# Patient Record
Sex: Female | Born: 1970 | Race: Black or African American | Hispanic: No | Marital: Single | Smoking: Never smoker
Health system: Southern US, Community
[De-identification: ages and names within clinical notes are randomized; demographics above are authoritative.]

## PROBLEM LIST (undated history)

## (undated) DIAGNOSIS — Z9889 Other specified postprocedural states: Secondary | ICD-10-CM

## (undated) DIAGNOSIS — I10 Essential (primary) hypertension: Secondary | ICD-10-CM

## (undated) DIAGNOSIS — R112 Nausea with vomiting, unspecified: Secondary | ICD-10-CM

## (undated) DIAGNOSIS — R87619 Unspecified abnormal cytological findings in specimens from cervix uteri: Secondary | ICD-10-CM

## (undated) DIAGNOSIS — Z5189 Encounter for other specified aftercare: Secondary | ICD-10-CM

## (undated) HISTORY — DX: Encounter for other specified aftercare: Z51.89

## (undated) HISTORY — DX: Unspecified abnormal cytological findings in specimens from cervix uteri: R87.619

## (undated) HISTORY — PX: WISDOM TOOTH EXTRACTION: SHX21

## (undated) HISTORY — PX: BREAST REDUCTION SURGERY: SHX8

---

## 1998-08-17 ENCOUNTER — Other Ambulatory Visit: Admission: RE | Admit: 1998-08-17 | Discharge: 1998-08-17 | Payer: Self-pay | Admitting: Family Medicine

## 1999-05-17 ENCOUNTER — Emergency Department (HOSPITAL_COMMUNITY): Admission: EM | Admit: 1999-05-17 | Discharge: 1999-05-17 | Payer: Self-pay

## 1999-06-06 ENCOUNTER — Other Ambulatory Visit: Admission: RE | Admit: 1999-06-06 | Discharge: 1999-06-06 | Payer: Self-pay | Admitting: Obstetrics and Gynecology

## 1999-09-20 ENCOUNTER — Inpatient Hospital Stay (HOSPITAL_COMMUNITY): Admission: AD | Admit: 1999-09-20 | Discharge: 1999-09-20 | Payer: Self-pay | Admitting: *Deleted

## 1999-10-04 ENCOUNTER — Inpatient Hospital Stay (HOSPITAL_COMMUNITY): Admission: AD | Admit: 1999-10-04 | Discharge: 1999-10-04 | Payer: Self-pay | Admitting: *Deleted

## 1999-10-10 ENCOUNTER — Other Ambulatory Visit: Admission: RE | Admit: 1999-10-10 | Discharge: 1999-10-10 | Payer: Self-pay | Admitting: Obstetrics & Gynecology

## 1999-11-23 ENCOUNTER — Inpatient Hospital Stay (HOSPITAL_COMMUNITY): Admission: AD | Admit: 1999-11-23 | Discharge: 1999-11-25 | Payer: Self-pay | Admitting: Obstetrics & Gynecology

## 2000-12-06 ENCOUNTER — Encounter: Payer: Self-pay | Admitting: Family Medicine

## 2000-12-06 ENCOUNTER — Encounter: Admission: RE | Admit: 2000-12-06 | Discharge: 2000-12-06 | Payer: Self-pay | Admitting: Family Medicine

## 2001-04-10 HISTORY — PX: REDUCTION MAMMAPLASTY: SUR839

## 2004-04-28 ENCOUNTER — Inpatient Hospital Stay (HOSPITAL_COMMUNITY): Admission: AD | Admit: 2004-04-28 | Discharge: 2004-04-28 | Payer: Self-pay | Admitting: Obstetrics & Gynecology

## 2005-10-05 ENCOUNTER — Emergency Department (HOSPITAL_COMMUNITY): Admission: EM | Admit: 2005-10-05 | Discharge: 2005-10-05 | Payer: Self-pay | Admitting: Family Medicine

## 2006-05-01 ENCOUNTER — Emergency Department (HOSPITAL_COMMUNITY): Admission: EM | Admit: 2006-05-01 | Discharge: 2006-05-01 | Payer: Self-pay | Admitting: Emergency Medicine

## 2007-05-03 ENCOUNTER — Ambulatory Visit: Payer: Self-pay | Admitting: Internal Medicine

## 2007-05-03 DIAGNOSIS — R0609 Other forms of dyspnea: Secondary | ICD-10-CM | POA: Insufficient documentation

## 2007-05-03 DIAGNOSIS — R0989 Other specified symptoms and signs involving the circulatory and respiratory systems: Secondary | ICD-10-CM | POA: Insufficient documentation

## 2007-06-23 ENCOUNTER — Inpatient Hospital Stay (HOSPITAL_COMMUNITY): Admission: AD | Admit: 2007-06-23 | Discharge: 2007-06-23 | Payer: Self-pay | Admitting: Obstetrics

## 2007-08-01 ENCOUNTER — Inpatient Hospital Stay (HOSPITAL_COMMUNITY): Admission: AD | Admit: 2007-08-01 | Discharge: 2007-08-05 | Payer: Self-pay | Admitting: Obstetrics and Gynecology

## 2007-08-01 ENCOUNTER — Ambulatory Visit: Payer: Self-pay | Admitting: Obstetrics & Gynecology

## 2007-08-02 ENCOUNTER — Encounter (INDEPENDENT_AMBULATORY_CARE_PROVIDER_SITE_OTHER): Payer: Self-pay | Admitting: Obstetrics and Gynecology

## 2008-02-29 ENCOUNTER — Emergency Department (HOSPITAL_COMMUNITY): Admission: EM | Admit: 2008-02-29 | Discharge: 2008-02-29 | Payer: Self-pay | Admitting: *Deleted

## 2008-10-07 ENCOUNTER — Emergency Department (HOSPITAL_COMMUNITY): Admission: EM | Admit: 2008-10-07 | Discharge: 2008-10-08 | Payer: Self-pay | Admitting: Emergency Medicine

## 2009-03-24 ENCOUNTER — Emergency Department (HOSPITAL_COMMUNITY): Admission: EM | Admit: 2009-03-24 | Discharge: 2009-03-24 | Payer: Self-pay | Admitting: Emergency Medicine

## 2010-02-09 ENCOUNTER — Other Ambulatory Visit
Admission: RE | Admit: 2010-02-09 | Discharge: 2010-02-09 | Payer: Self-pay | Source: Home / Self Care | Admitting: Family Medicine

## 2010-07-18 LAB — BASIC METABOLIC PANEL
BUN: 9 mg/dL (ref 6–23)
Creatinine, Ser: 0.71 mg/dL (ref 0.4–1.2)
GFR calc Af Amer: >60 mL/min (ref >60)
GFR calc non Af Amer: >60 mL/min (ref >60)
Potassium: 3.4 mEq/L — ABNORMAL LOW (ref 3.5–5.1)

## 2010-07-18 LAB — POCT CARDIAC MARKERS
CKMB, poc: <1 ng/mL — ABNORMAL LOW (ref 1.0–8.0)
Myoglobin, poc: 42.2 ng/mL (ref 12–200)
Troponin i, poc: <0.05 ng/mL (ref 0.00–0.09)

## 2010-07-18 LAB — DIFFERENTIAL
Lymphocytes Relative: 9 % — ABNORMAL LOW (ref 12–46)
Lymphs Abs: 0.6 10*3/uL — ABNORMAL LOW (ref 0.7–4.0)
Monocytes Relative: 9 % (ref 3–12)
Neutrophils Relative %: 78 % — ABNORMAL HIGH (ref 43–77)

## 2010-07-18 LAB — URINALYSIS, ROUTINE W REFLEX MICROSCOPIC
Bilirubin Urine: NEGATIVE
Ketones, ur: NEGATIVE mg/dL
Nitrite: NEGATIVE
Protein, ur: NEGATIVE mg/dL
Urobilinogen, UA: 0.2 mg/dL (ref 0.0–1.0)

## 2010-07-18 LAB — CBC
Platelets: 289 10*3/uL (ref 150–400)
RBC: 4.95 MIL/uL (ref 3.87–5.11)
WBC: 7 10*3/uL (ref 4.0–10.5)

## 2010-07-18 LAB — D-DIMER, QUANTITATIVE: D-Dimer, Quant: 0.32 ug/mL-FEU (ref 0.00–0.48)

## 2010-08-23 NOTE — Op Note (Signed)
NAME:  Kathy Harrison, Kathy Harrison                   ACCOUNT NO.:  [id number]   MEDICAL RECORD NO.:  [id number]          PATIENT TYPE:  INP   LOCATION:  9309                          FACILITY:  [facility]   PHYSICIAN:  Lendon Colonel, MD   DATE OF BIRTH:  1971/01/21   DATE OF PROCEDURE:  08/02/2007  DATE OF DISCHARGE:                               OPERATIVE REPORT   PREOPERATIVE DIAGNOSES:  Breech in labor, fetal bradycardia, anuria.   ADDENDUM:  Breech in labor, fetal bradycardia, anuria.   PROCEDURE:  Primary low transverse cesarean section, cystoscopy.   SURGEON:  Lendon Colonel, MD   ASSISTANT:  Allie Bossier, MD   ANESTHESIA:  General anesthesia.   SPECIMEN:  Placenta sent to pathology.   ESTIMATED BLOOD LOSS:  1000 mL.   COMPLICATIONS:  None.   FINDINGS:  Fetus in the frank breech position. Baby girl.  Apgars 2, 4  and 6.  Cord gas 7.07.  Boggy uterus.  Normal tubes and ovaries.  Anuria  at the end of the C-section.   INDICATIONS:  This is a multiparous patient at term with known  oligohydramnios, being induced for oligo at term.  The patient had  received Cervidil overnight and had spontaneous rupture of membranes  around midnight.  At about 3 a.m. the patient started exhibiting  increased pain, was checked, found to be 6 cm and then around 3:30  exhibited late decelerations to the 60s, repeat exam by M.D. revealed  the presence of breech presentation, fully dilated.  Ultrasound  confirmed breech presentation.  Given breech in labor, uncomfortable and  fully dilated as well as fetal decelerations decision was made to  proceed to the operating room.   OPERATIVE NOTE:  After informed consent was obtained, general anesthesia  was initiated.  She was prepped and draped.  Times of the events and a  further decscription of the series of events can be found in the written  note in the chart.  A Pfannenstiel skin incision was made with the  scalpel,  carried through to the underlying layer of  fascia with the  scalpel.  The fascia was incised in the midline with the scalpel.  The  fascial incision was extended bluntly.  The pyramidalis muscles were  separated bluntly.  The rectus muscles were separated bluntly.  The  peritoneum was identified and entered bluntly.  The incision was  extended bluntly.  The bladder blade was inserted.  The vesicouterine  peritoneum was grasped with pickups and entered sharply.  Incision was  extended laterally with the Metzenbaum scissors and bladder flap was  created digitally.  The bladder blade was reinserted.  The lower uterine  segment was incised in a transverse fashion with the scalpel and  extended bluntly.  The infant's sacrum was delivered without  complication in the frank breech position.  The shoulders were delivered  and initial attempt at delivering the fetal occiput in head flexion was  unsuccessful.  The bandage scissors were called for and a small T  incision of the lower segment was carried out and  the infant's head was  delivered.  Time from incision to delivery of the infant was  approximately two minutes.  Infant was handed off to the NICU team.  The  placenta was expressed.  The uterus was cleared of all clots and debris.  The bladder blade and retractor were replaced and the incision edges  were tagged with ring forceps.  A right lower extension was noted.  This  was repaired primarily with 0 Vicryl.  The T portion that remained in  the lower uterine segment was then repaired with O Vicryl in a running  locked fashion.  The uterine incision was then reinspected.  The angles  were tagged and the uterine incision was repaired from the patient's  right angle to the left angle in a running locked incision. Good  hemostasis was noted at first and some bleeding was noted from all  angles.  A total of approximately 12 additional figure-of-eight sutures  were used to obtain hemostasis both in the right and left lower  extension and  along the upper portion of the T of the incision.  Of  note, there was some bleeding very close to the broad ligament and the  uterine vessels were carefully palpated before throwing any stitches.  Of note, the uterine incision was just superior to the level of cervix  and the cervix was easily seen during repair and care was taken to avoid  incorporation of the cervix into the uterine closure.  Given the amount  of bleeding and the difficult accessing the extensions, although the  uterus was initially repaired in the abdomen, the uterus was removed  from the abdomen to complete the repair.  Once hemostatic, the uterus  was returned to the abdomen.  The enlarged boggy uterus was difficutlt  to return to the abdomen. The skin incision had to be extended as well  and the and  left rectus muscle to allow the uterus to be placed back in  the abdomen. The L rectus muscle was  transected about one-quarter of  their length, with the Bovie cautery.  Given the atony the patient did  receive 0.2 mg IM Methergine.  The uterus was then able to be returned  to the abdomen.  The incisions were reinspected and several additional  figure-of-eight sutures were used to control hemostasis.  The cut muscle  edges were inspected, found to be hemostatic.  The underside of the  fascia was inspected, found to be hemostatic.  The fascia was closed  with O Vicryl in a running locked fashion.  Subcutaneous tissue was  irrigated.  A 2-0 plain gut suture was used to close Scarpa's layer and  the skin was closed with staples.  At the end of the case, given the  urgent nature of the C-section and the inability to count instruments or  sponges, an x-ray was obtained.  During this time it was noted the  patient had made no urine during the case.  The Foley was replaced and  still no urine was noted in the bladder.  The patient had received about  5 liters of IV fluids.  Given the urgent nature of the C-section and the   number of sutures that were placed down close to the cervix, decision  was made to proceed with cystoscopy.  A 30 degree cystoscope was used.  The bladder was dilated, filled and noted to be intact.  Bilateral  ureters were seen peristalsing blue jet from the Indigo Carmine dye  that  had been injected IV just prior to the start of the cystoscopy.  Cystoscopy was completed.  A second x-ray was obtained to ensure no  instruments in the abdomen.  This second x-ray was clear and the patient  was awoken from general anesthesia, having tolerated the procedure well.  Sponge, lap and needle counts were correct x3 and a repeat CBC is  pending for 6 hours after the case.      Lendon Colonel, MD  Electronically Signed     KAF/MEDQ  D:  08/02/2007  T:  08/02/2007  Job:  [id number]

## 2010-08-26 NOTE — Discharge Summary (Signed)
NAME:  Kathy Harrison, Kathy Harrison                   ACCOUNT NO.:  [id number]   MEDICAL RECORD NO.:  [id number]          PATIENT TYPE:  INP   LOCATION:  9309                          [facility]ACILITY:  [facility]   PHYSICIAN:  Lendon Colonel, MD   DATE OF BIRTH:  [birth year]   DATE OF ADMISSION:  08/01/2007  DATE OF DISCHARGE:  08/05/2007                               DISCHARGE SUMMARY   CHIEF COMPLAINT:  Induction of labor for oligohydramnios.   HISTORY OF PRESENT ILLNESS:  This is a 40 year old G6, P2-0-3-2 admitted  40 weeks and 2 days for oligohydramnios at term.  The patient had no  complaints of leakage, contractions, vaginal bleeding, and was noting  good fetal activity.  Her prenatal course is significant for onset of  care at 9 weeks with [facility].   ALLERGIES:  No allergies other than nausea to aspirin.   MEDICATIONS:  Prenatal vitamins.   OBSTETRICAL HISTORY:  Significant for 2 vaginal deliveries, 1  therapeutic abortion, 2 spontaneous abortions.   PAST MEDICAL HISTORY:  Significant for GERD and sickle cell trait.   PAST SURGICAL HISTORY:  Significant for breast reduction.   The remainder of her past medical, surgical, obstetrical, and  gynecologic history can be found in her admission H&P and her OB flow  sheet.   Her prenatal labs were within normal limits.  Her diabetic screen was  112.  HIV was negative.  Rubella was immune, and she was O+.  Her AFP  quad was negative as well.  Her exam on admission noted reactive fetal  testing and a blood pressure of 120/80.  Her cervix was tight 1 cm and  thick.   ASSESSMENT/PLAN:  The patient was admitted for postdates induction of  labor given oligohydramnios at 40 and 2 days.  Plan for Cervidil  overnight for ripening and Pitocin in the a.m.  Overnight between  hospital day #1 and 2, the patient was noting increased contractions and  was checked by the nurse and found to be 6 cm dilated.  Her Cervidil was  pulled at that time and an epidural  was ordered given pt's pain.  Approximately 30 minutes later, the patient was having increasing pain  and MD was called for evaluation.  Upon arrival to the room, the patient  was rechecked and found to be in the breech position and fully dilated,  fetal decel to the 60s were noted as well.  Ultrasound was done, which  confirmed the breech position terbutaline was given, given the patient's  frequent painful contractions and decision was made to proceed to the OR  for primary low transverse cesarean section.  Full time line of events  is documented in the paper chart.  The patient was taken to the  operating room where a urgent cesarean section was performed and the  infant was delivered in the breech position.  Time from incision to  delivery was approximately 2 minutes.  NICU was present for  resuscitation.  The operative note to be found in a separate dictation.  Briefly, the patient had bilateral lower uterine  segment extensions,  which were repaired primarily in several layers and about 1000 mL blood  loss.  The patient did receive 2 g of antibiotics Ancef preoperatively.  At the end of the case given this was an urgent C-section and there was  no time for counting of the instruments, an x-ray had to be obtained,  which initially revealed a question of a retained foreign body, however,  repeat x-ray revealed the abdomen and pelvis were clean of any foreign  bodies. During the 2 hours  the patient was in the OR (including time  for x-ray x 2), the patient received 5 liters of fluid replacement but  had no urine output.  A cystoscopy was carried out, which revealed  bilateral patent ureteral jets peristalsing fluid with indigo carmine.  Despite the fact the patient had preoperatively wanted a tubal ligation  at the time of C-section, this was not carried out during the operation  given the unclear outcome of the baby and this surgeon's unfamiliarity  with the patient.  Numerous attempts  were made to ensure that the  patient had complete understanding of the events surrounding her C-  section.  Postoperatively, the patient began showing signs and symptoms  of anemia while her clinical exam was stable.  She did receive 2 units  of packed red cells.  By postoperative day #3, the patient was doing  well, had her staples removed, had no signs of symptoms of anemia.  Her  vital signs were stable.  She is ambulating and avoiding without  difficulty.  Decision was made to send the patient home.   CONDITION ON DISCHARGE:  Stable.   DISCHARGE DISPOSITION:  Home.   DISCHARGE MEDICATIONS:  1. Percocet.  2. Motrin.  3. Tylenol.   DISCHARGE DIAGNOSES:  Status post urgent primary low transverse cesarean  section for breech position in active labor, induction of labor for  oligohydramnios, anemia.      Lendon Colonel, MD  Electronically Signed     KAF/MEDQ  D:  08/27/2007  T:  08/28/2007  Job:  [id number]

## 2010-08-26 NOTE — H&P (Signed)
[facility] [place] [facility]  Patient:    Kathy Harrison, Kathy Harrison                          MRN: [id number] Adm. Date:  [id number] Attending:  Leonard Schwartz Dictator:   Nigel Bridgeman, C.N.M.                         History and Physical  HISTORY OF PRESENT ILLNESS:   Ms. Halbert is a 40 year old gravida 4, para 1-0-2-1 at 38-1/2 weeks who presents with uterine contractions every three minutes for several hours.  She denies any leaking.  Does report positive bloody show, positive fetal movement.  The pregnancy has been remarkable for: 1. Positive group B strep. 2. Two ABs. 3. Sickle cell trait. 4. Ascus on Pap, needs repeat postpartum.  PRENATAL LABORATORY DATA:     Blood type is O positive, Rh antibody negative, VDRL nonreactive.  Rubella titer is equivocal.  Hepatitis B surface antigen is negative.  HIV nonreactive.  Hemoglobin electrophoresis is positive.  Sickle cell trait positive.  GC and chlamydia cultures were negative.  Ascus on Pap was noted, and ascus was repeat on Pap.  Glucose challenge was normal.  AFP was normal.  Hemoglobin upon entering the practice was 12.9.  It was 11.5 at 26 weeks.  Group B strep culture was positive at 36 weeks.  EDC of December 08, 1999, was established by last menstrual period and was in agreement with ultrasound at approximately 18 weeks.  HISTORY OF PRESENT PREGNANCY: The patient entered care at approximately 13 weeks.  She had been put out of work by Dr. Parke Simmers secondary to early cramping. The patient did have some cramping at 28 weeks, cervix was normal.  She was sent to the hospital for IV fluids and terbutaline.  She had an ultrasound at 29 weeks which was 92%.  AFI was normal, cervix was normal.  She had a repeat ultrasound at 33 weeks at the 90th percentile with normal AFI.  Ascus repeated on Pap, and the plan was to repeat postpartum.  PREGNANCY HISTORY:            In 1988, she had a therapeutic termination of pregnancy without  complications.  In 1989, she had a vaginal birth of a female infant, weight 6 pounds 15 ounces, at [redacted] weeks gestation.  She was in laboratory approximately three hours.  She had IV pain medication, no complications.  In 1992, she had a spontaneous miscarriage with a D&C without complications.  She did have anemia in her previous pregnancy.  She had a D&C in 1992.  PAST MEDICAL HISTORY:         She had occasional yeast infections.  She reports the usual usual childhood illnesses.  She has had varicose veins since 1989.  She did have some anemia in the past.  She had gastroesophageal reflux disease diagnosed two years ago but has had no problems.  She had a UTI after her pregnancy in 1989.  At age 48 she had her wisdom teeth extracted.  Her only other hospitalization was for childbirth.  FAMILY HISTORY:               Her mother is a heroin addict and nicotine addict.  Her maternal aunt uses alcohol.  Her maternal grandmother uses alcohol.  GENETIC HISTORY:              The father  of the babys mother having some type of heart murmur and other heart problems.  The patient has sickle cell trait, and the patients mother has sickle cell trait.  SOCIAL HISTORY:               The patient is married to the father of the baby.  He is involved and supportive.  His name is Database administrator.  The patient is African-American, of the [place] faith.  She has been followed initially by the physician service of [facility], then transferred to the midwifery service in the third trimester.  She has one year of college and is employed with [employer].  Her husband is high-school educated and is a Curator.  PHYSICAL EXAMINATION:  VITAL SIGNS:                  Vital signs are stable.  The patient is afebrile.  HEENT:                        Within normal limits.  LUNGS:                        Bilateral breath sounds are clear.  HEART:                        Regular rate and rhythm, without  murmur.  BREASTS:                      Soft and nontender.  ABDOMEN:                      Fundal height is approximately 38 cm.  Estimated fetal weight is 8 to 8-1/2 pounds.  Uterine contractions are every three minutes, moderate quality.  PELVIC:                       On cervical exam 5-6 cm, 80%, vertex at a -1 station, with bulging bag of water.  Fetal heart rate is reactive, with no decelerations.  EXTREMITIES:                  Deep tendon reflexes are 3+, without clonus. There is a trace edema noted.  IMPRESSION:                   1. Intrauterine pregnancy at 38-1/2 weeks.                               2. Active labor.                               3. Positive group B strep.                               4. Equivocal rubella.  PLAN:                         1. Admit to birthing suite for consult with                                  Dr. Marline Backbone as attending physician.  2. Routine certified nurse midwife orders.                               3. Plan group B strep prophylaxis with                                  penicillin G per standard dosing.                               4. Plan Stadol IV 1-2 hours p.r.n.                               5. Anticipate normal spontaneous vaginal birth. DD:  11/23/99 TD:  11/23/99 Job: [id number]

## 2011-01-03 LAB — CROSSMATCH
ABO/RH(D): O POS
Antibody Screen: NEGATIVE

## 2011-01-03 LAB — CBC
HCT: 20.6 — ABNORMAL LOW
HCT: 21.3 — ABNORMAL LOW
Hemoglobin: 11.9 — ABNORMAL LOW
Hemoglobin: 7.5 — CL
Hemoglobin: 9 — ABNORMAL LOW
MCHC: 33.7
MCHC: 34.1
MCHC: 34.2
MCHC: 34.2
MCHC: 34.6
MCV: 76.4 — ABNORMAL LOW
MCV: 78.4
MCV: 79.1
Platelets: 266
Platelets: 304
RBC: 2.71 — ABNORMAL LOW
RBC: 2.77 — ABNORMAL LOW
RBC: 3.25 — ABNORMAL LOW
RDW: 19.2 — ABNORMAL HIGH
RDW: 19.3 — ABNORMAL HIGH
RDW: 19.4 — ABNORMAL HIGH
RDW: 20 — ABNORMAL HIGH

## 2011-01-03 LAB — COMPREHENSIVE METABOLIC PANEL
ALT: 10
AST: 31
Albumin: 1.8 — ABNORMAL LOW
BUN: 3 — ABNORMAL LOW
Calcium: 8.4
Creatinine, Ser: 0.86
GFR calc Af Amer: >60
Sodium: 134 — ABNORMAL LOW
Total Bilirubin: 0.7
Total Protein: 4.4 — ABNORMAL LOW
Total Protein: 4.7 — ABNORMAL LOW

## 2011-07-31 ENCOUNTER — Emergency Department (HOSPITAL_COMMUNITY)
Admission: EM | Admit: 2011-07-31 | Discharge: 2011-08-01 | Disposition: A | Discharge status: Home / Self Care | Payer: Medicaid Other | Attending: Emergency Medicine | Admitting: Emergency Medicine

## 2011-07-31 ENCOUNTER — Encounter (HOSPITAL_COMMUNITY): Payer: Self-pay

## 2011-07-31 DIAGNOSIS — J069 Acute upper respiratory infection, unspecified: Secondary | ICD-10-CM

## 2011-07-31 DIAGNOSIS — R05 Cough: Secondary | ICD-10-CM | POA: Insufficient documentation

## 2011-07-31 DIAGNOSIS — R059 Cough, unspecified: Secondary | ICD-10-CM | POA: Insufficient documentation

## 2011-07-31 MED ORDER — PREDNISONE 20 MG PO TABS
60.0000 mg | ORAL_TABLET | Freq: Once | ORAL | Status: AC
  Administered 2011-07-31: 60 mg via ORAL
  Filled 2011-07-31: qty 3

## 2011-07-31 MED ORDER — ALBUTEROL (5 MG/ML) CONTINUOUS INHALATION SOLN: 5.0000 mg/h | INHALATION_SOLUTION | Freq: Once | RESPIRATORY_TRACT | Status: DC

## 2011-07-31 NOTE — ED Notes (Signed)
Pt complains of cough and congestion for three weeks.

## 2011-07-31 NOTE — ED Notes (Signed)
Pt reports productive cough with clear sputum and nasal congestion.  Pt reports symptoms started with sinus congestion and then progressed to cough and chest congestion.  Pt reports tactile fever at home the past two days.  Pt denies history of asthma or seasonal allergies, but has had bronchitis before.  Pt was tried OTC tylenol sinus and Nyquil and ibuprofen without relief. Pt does not own an inhaler.

## 2011-07-31 NOTE — ED Notes (Signed)
Resp called for breathing treatment.   

## 2011-08-01 MED ORDER — HYDROCODONE-ACETAMINOPHEN 7.5-500 MG/15ML PO SOLN: 10.0000 mL | Freq: Four times a day (QID) | ORAL | Status: AC | PRN

## 2011-08-01 MED ORDER — ALBUTEROL SULFATE (5 MG/ML) 0.5% IN NEBU
5.0000 mg | INHALATION_SOLUTION | Freq: Once | RESPIRATORY_TRACT | Status: AC
  Administered 2011-08-01: 5 mg via RESPIRATORY_TRACT

## 2011-08-01 MED ORDER — ALBUTEROL SULFATE (5 MG/ML) 0.5% IN NEBU: 2.5000 mg | INHALATION_SOLUTION | Freq: Once | RESPIRATORY_TRACT | Status: DC

## 2011-08-01 MED ORDER — ALBUTEROL SULFATE (5 MG/ML) 0.5% IN NEBU
INHALATION_SOLUTION | RESPIRATORY_TRACT | Status: AC
  Filled 2011-08-01: qty 1

## 2011-08-01 MED ORDER — AZITHROMYCIN 250 MG PO TABS: 250.0000 mg | ORAL_TABLET | Freq: Every day | ORAL | Status: AC

## 2011-08-01 MED ORDER — PREDNISONE 20 MG PO TABS: 60.0000 mg | ORAL_TABLET | Freq: Every day | ORAL | Status: DC

## 2011-08-01 MED ORDER — ALBUTEROL SULFATE HFA 108 (90 BASE) MCG/ACT IN AERS
2.0000 | INHALATION_SPRAY | RESPIRATORY_TRACT | Status: DC | PRN
  Administered 2011-08-01: 2 via RESPIRATORY_TRACT
  Filled 2011-08-01: qty 6.7

## 2011-08-01 NOTE — Discharge Instructions (Signed)
FOLLOW UP WITH DR. BLAND THIS WEEK FOR RECHECK. TAKE MEDICATIONS AS PRESCRIBED. RETURN HERE AS NEEDED. RECOMMEND PLAIN SALINE NASAL SPRAY AS WELL.   Upper Respiratory Infection, Adult An upper respiratory infection (URI) is also known as the common cold. It is often caused by a type of germ (virus). Colds are easily spread (contagious). You can pass it to others by kissing, coughing, sneezing, or drinking out of the same glass. Usually, you get better in 1 or 2 weeks.  HOME CARE   Only take medicine as told by your doctor.   Use a warm mist humidifier or breathe in steam from a hot shower.   Drink enough water and fluids to keep your pee (urine) clear or pale yellow.   Get plenty of rest.   Return to work when your temperature is back to normal or as told by your doctor. You may use a face mask and wash your hands to stop your cold from spreading.  GET HELP RIGHT AWAY IF:   After the first few days, you feel you are getting worse.   You have questions about your medicine.   You have chills, shortness of breath, or brown or red spit (mucus).   You have yellow or brown snot (nasal discharge) or pain in the face, especially when you bend forward.   You have a fever, puffy (swollen) neck, pain when you swallow, or white spots in the back of your throat.   You have a bad headache, ear pain, sinus pain, or chest pain.   You have a high-pitched whistling sound when you breathe in and out (wheezing).   You have a lasting cough or cough up blood.   You have sore muscles or a stiff neck.  MAKE SURE YOU:   Understand these instructions.   Will watch your condition.   Will get help right away if you are not doing well or get worse.  Document Released: 09/13/2007 Document Revised: 03/16/2011 Document Reviewed: 08/01/2010 [facility] Patient Information 2012 [place], [place].

## 2011-08-01 NOTE — ED Provider Notes (Signed)
Medical screening examination/treatment/procedure(s) were performed by non-physician practitioner and as supervising physician I was immediately available for consultation/collaboration.  Brieanna Nau K Flavius Repsher-Rasch, MD 08/01/11 0030 

## 2011-08-01 NOTE — ED Provider Notes (Signed)
History     CSN: [id number]  Arrival date & time 07/31/11  2101   First MD Initiated Contact with Patient 07/31/11 2326      Chief Complaint  Patient presents with  . Cough  . Nasal Congestion    (Consider location/radiation/quality/duration/timing/severity/associated sxs/prior treatment) Patient is a 41 y.o. female presenting with cough. The history is provided by the patient.  Cough This is a new problem. The current episode started more than 1 week ago. The problem occurs constantly. The problem has not changed since onset.Associated symptoms include chills. Pertinent negatives include no chest pain, no sore throat and no shortness of breath. Associated symptoms comments: She reports cough for 3 weeks that become intense enough to cause gagging. Worse at night when she lies down. No know fever, but she reports chills. No night sweats. No N, V. She has nasal congestion and sinus pressure. No sick contacts..    History reviewed. No pertinent past medical history.  History reviewed. No pertinent past surgical history.  History reviewed. No pertinent family history.  History  Substance Use Topics  . Smoking status: Not on file  . Smokeless tobacco: Not on file  . Alcohol Use: No    OB History    Grav Para Term Preterm Abortions TAB SAB Ect Mult Living                  Review of Systems  Constitutional: Positive for chills. Negative for fever.  HENT: Positive for congestion and sinus pressure. Negative for sore throat.   Respiratory: Positive for cough. Negative for shortness of breath.   Cardiovascular: Negative.  Negative for chest pain.  Gastrointestinal: Negative.     Allergies  Aspirin  Home Medications   Current Outpatient Rx  Name Route Sig Dispense Refill  . IBUPROFEN 200 MG PO TABS Oral Take 400 mg by mouth every 6 (six) hours as needed.    . NYQUIL PO Oral Take 1 tablet by mouth at bedtime as needed. For sinus      BP 147/99  Pulse 103  Temp(Src)  99.2 F (37.3 C) (Oral)  Resp 20  SpO2 100%  LMP 07/17/2011  Physical Exam  Constitutional: She appears well-developed and well-nourished.  HENT:  Head: Normocephalic.  Right Ear: External ear normal.  Left Ear: External ear normal.  Nose: Mucosal edema present. Right sinus exhibits frontal sinus tenderness. Left sinus exhibits frontal sinus tenderness.  Mouth/Throat: Oropharynx is clear and moist.  Neck: Normal range of motion. Neck supple.  Cardiovascular: Normal rate and normal heart sounds.   No murmur heard. Pulmonary/Chest: Effort normal and breath sounds normal. She has no wheezes. She has no rales.       Actively coughing with respirations.  Abdominal: Soft. Bowel sounds are normal. She exhibits no distension. There is no tenderness.  Musculoskeletal: Normal range of motion.  Lymphadenopathy:    She has no cervical adenopathy.  Skin: Skin is warm and dry. No pallor.    ED Course  Procedures (including critical care time)  Labs Reviewed - No data to display No results found.   No diagnosis found. 1. URI 2. cough   MDM  Breathing treatment with cough reduction. Given duration of symptoms will give Z-pack, Albuterol inhaler and prednisone. She will follow up with Dr. Parke Simmers for recheck this week.         Kathy Medin, PA-C 08/01/11 [phone]

## 2011-09-15 ENCOUNTER — Inpatient Hospital Stay (HOSPITAL_COMMUNITY)
Admission: AD | Admit: 2011-09-15 | Discharge: 2011-09-15 | Disposition: A | Discharge status: Home / Self Care | Payer: Medicaid Other | Source: Ambulatory Visit | Attending: Obstetrics & Gynecology | Admitting: Obstetrics & Gynecology

## 2011-09-15 ENCOUNTER — Encounter (HOSPITAL_COMMUNITY): Payer: Self-pay | Admitting: *Deleted

## 2011-09-15 ENCOUNTER — Inpatient Hospital Stay (HOSPITAL_COMMUNITY): Payer: Medicaid Other

## 2011-09-15 DIAGNOSIS — R109 Unspecified abdominal pain: Secondary | ICD-10-CM | POA: Insufficient documentation

## 2011-09-15 DIAGNOSIS — N838 Other noninflammatory disorders of ovary, fallopian tube and broad ligament: Secondary | ICD-10-CM | POA: Insufficient documentation

## 2011-09-15 DIAGNOSIS — R102 Pelvic and perineal pain: Secondary | ICD-10-CM

## 2011-09-15 LAB — CBC
HCT: 39 % (ref 36.0–46.0)
Hemoglobin: 13.1 g/dL (ref 12.0–15.0)
MCHC: 33.6 g/dL (ref 30.0–36.0)
RBC: 4.96 MIL/uL (ref 3.87–5.11)

## 2011-09-15 LAB — URINALYSIS, ROUTINE W REFLEX MICROSCOPIC
Ketones, ur: NEGATIVE mg/dL
Leukocytes, UA: NEGATIVE
Nitrite: NEGATIVE
Specific Gravity, Urine: 1.015 (ref 1.005–1.030)
Urobilinogen, UA: 0.2 mg/dL (ref 0.0–1.0)
pH: 7 (ref 5.0–8.0)

## 2011-09-15 LAB — WET PREP, GENITAL: Trich, Wet Prep: NONE SEEN

## 2011-09-15 MED ORDER — ACETAMINOPHEN-CODEINE #3 300-30 MG PO TABS
2.0000 | ORAL_TABLET | Freq: Once | ORAL | Status: AC
  Administered 2011-09-15: 2 via ORAL
  Filled 2011-09-15: qty 2

## 2011-09-15 MED ORDER — IBUPROFEN 600 MG PO TABS: 600.0000 mg | ORAL_TABLET | Freq: Four times a day (QID) | ORAL | Status: AC | PRN

## 2011-09-15 NOTE — MAU Provider Note (Signed)
History     CSN: [id number]  Arrival date and time: 09/15/11 1426   None     Chief Complaint  Patient presents with  . Flank Pain  . Abdominal Pain   HPI  Patient states she had sudden onset two days ago of left flank pain that radiates to the left lower abdomen. Thought it was gas and took laxatives with relief no from pain. Denies any bleeding or discharge. Pain is described as sharp and rated 8/10.  Also took Ibuprofen with relief for a brief amount of time.     History reviewed. No pertinent past medical history.  Past Surgical History  Procedure Date  . Cesarean section   . Breast reduction surgery     2003  . Wisdom tooth extraction     History reviewed. No pertinent family history.  History  Substance Use Topics  . Smoking status: Never Smoker   . Smokeless tobacco: Never Used  . Alcohol Use: No    Allergies:  Allergies  Allergen Reactions  . Aspirin Nausea And Vomiting    No prescriptions prior to admission    Review of Systems  Gastrointestinal: Positive for abdominal pain (left lower quadrant).  Genitourinary: Positive for flank pain (left).  All other systems reviewed and are negative.   Physical Exam   Blood pressure 124/79, pulse 98, temperature 98.7 F (37.1 C), temperature source Oral, resp. rate 20, height 5\' 2"  (1.575 m), weight 98.703 kg (217 lb 9.6 oz), last menstrual period 09/03/2011, SpO2 100.00%.  Physical Exam  Constitutional: She is oriented to person, place, and time. She appears well-developed and well-nourished. No distress.  HENT:  Head: Normocephalic.  Neck: Normal range of motion. Neck supple.  Cardiovascular: Normal rate, regular rhythm and normal heart sounds.  Exam reveals no gallop and no friction rub.   No murmur heard. Respiratory: Effort normal and breath sounds normal. No respiratory distress.  GI: Soft. She exhibits no distension. There is tenderness (left lower quad). There is no CVA tenderness.    Genitourinary: Cervix exhibits no motion tenderness and no discharge. Vaginal discharge (white, creamy) found.       Uterus and ovaries difficult to palpate secondary to weight  Musculoskeletal: Normal range of motion.  Neurological: She is alert and oriented to person, place, and time.  Skin: Skin is warm and dry.  Psychiatric: She has a normal mood and affect.    MAU Course  Procedures  Results for orders placed during the hospital encounter of 09/15/11 (from the past 24 hour(s))  POCT PREGNANCY, URINE     Status: Normal   Collection Time   09/15/11  3:40 PM      Component Value Range   Preg Test, Ur NEGATIVE  NEGATIVE   URINALYSIS, ROUTINE W REFLEX MICROSCOPIC     Status: Normal   Collection Time   09/15/11  3:42 PM      Component Value Range   Color, Urine YELLOW  YELLOW    APPearance CLEAR  CLEAR    Specific Gravity, Urine 1.015  1.005 - 1.030    pH 7.0  5.0 - 8.0    Glucose, UA NEGATIVE  NEGATIVE (mg/dL)   Hgb urine dipstick NEGATIVE  NEGATIVE    Bilirubin Urine NEGATIVE  NEGATIVE    Ketones, ur NEGATIVE  NEGATIVE (mg/dL)   Protein, ur NEGATIVE  NEGATIVE (mg/dL)   Urobilinogen, UA 0.2  0.0 - 1.0 (mg/dL)   Nitrite NEGATIVE  NEGATIVE    Leukocytes, UA NEGATIVE  NEGATIVE   WET PREP, GENITAL     Status: Abnormal   Collection Time   09/15/11  4:55 PM      Component Value Range   Yeast Wet Prep HPF POC NONE SEEN  NONE SEEN    Trich, Wet Prep NONE SEEN  NONE SEEN    Clue Cells Wet Prep HPF POC NONE SEEN  NONE SEEN    WBC, Wet Prep HPF POC FEW (*) NONE SEEN     IMPRESSION:  Pelvic ultrasound within normal limits. Follicle on left ovary Assessment and Plan  Possible Ovulation  Plan:  Ibuprofen for pain Follow-up prn  [facility] 09/15/2011, 4:45 PM

## 2011-09-15 NOTE — MAU Note (Signed)
Patient states she had sudden onset two days ago of left flank pain that radiates to the left lower abdomen. Thought it was gas and took laxatives with relief from pain. Denies any bleeding or discharge.

## 2011-09-16 LAB — GC/CHLAMYDIA PROBE AMP, GENITAL: Chlamydia, DNA Probe: NEGATIVE

## 2012-07-31 ENCOUNTER — Encounter (HOSPITAL_COMMUNITY): Payer: Self-pay | Admitting: General Practice

## 2012-07-31 ENCOUNTER — Emergency Department (HOSPITAL_COMMUNITY): Payer: Medicaid Other

## 2012-07-31 ENCOUNTER — Emergency Department (HOSPITAL_COMMUNITY)
Admission: EM | Admit: 2012-07-31 | Discharge: 2012-07-31 | Disposition: A | Discharge status: Home / Self Care | Payer: Medicaid Other | Attending: Emergency Medicine | Admitting: Emergency Medicine

## 2012-07-31 DIAGNOSIS — R05 Cough: Secondary | ICD-10-CM | POA: Insufficient documentation

## 2012-07-31 DIAGNOSIS — R112 Nausea with vomiting, unspecified: Secondary | ICD-10-CM | POA: Insufficient documentation

## 2012-07-31 DIAGNOSIS — R197 Diarrhea, unspecified: Secondary | ICD-10-CM | POA: Insufficient documentation

## 2012-07-31 DIAGNOSIS — J3489 Other specified disorders of nose and nasal sinuses: Secondary | ICD-10-CM | POA: Insufficient documentation

## 2012-07-31 DIAGNOSIS — R51 Headache: Secondary | ICD-10-CM | POA: Insufficient documentation

## 2012-07-31 DIAGNOSIS — Z79899 Other long term (current) drug therapy: Secondary | ICD-10-CM | POA: Insufficient documentation

## 2012-07-31 DIAGNOSIS — R059 Cough, unspecified: Secondary | ICD-10-CM | POA: Insufficient documentation

## 2012-07-31 MED ORDER — ALBUTEROL SULFATE HFA 108 (90 BASE) MCG/ACT IN AERS
2.0000 | INHALATION_SPRAY | Freq: Once | RESPIRATORY_TRACT | Status: AC
  Administered 2012-07-31: 2 via RESPIRATORY_TRACT
  Filled 2012-07-31: qty 6.7

## 2012-07-31 MED ORDER — HYDROCODONE-HOMATROPINE 5-1.5 MG/5ML PO SYRP: 5.0000 mL | ORAL_SOLUTION | Freq: Four times a day (QID) | ORAL | Status: DC | PRN

## 2012-07-31 MED ORDER — ALBUTEROL SULFATE (5 MG/ML) 0.5% IN NEBU
5.0000 mg | INHALATION_SOLUTION | Freq: Once | RESPIRATORY_TRACT | Status: AC
  Administered 2012-07-31: 5 mg via RESPIRATORY_TRACT
  Filled 2012-07-31: qty 1

## 2012-07-31 NOTE — ED Provider Notes (Signed)
Patient hand-off from [facility]. Awaiting results of d-dimer. This was normal range. Patient was discharged (not by myself) before I could re-examine her.   Renne Crigler, PA-C 07/31/12 2328

## 2012-07-31 NOTE — ED Notes (Signed)
Pt alert and oriented x4. Respirations even and unlabored, bilateral symmetrical rise and fall of chest. Skin warm and dry. In no acute distress. Denies needs.   

## 2012-07-31 NOTE — ED Provider Notes (Signed)
History     CSN: [id number]  Arrival date & time 07/31/12  4098   First MD Initiated Contact with Patient 07/31/12 1847      Chief Complaint  Patient presents with  . Cough  . Nausea    (Consider location/radiation/quality/duration/timing/severity/associated sxs/prior treatment) HPI Pt is a 42 y.o. female who presents to the ED c/o cough for 4 weeks.  Patient states the cough is constant and productive with yellow-white sputum.    Patient states symptoms were initially similar to her frequent sinus infections but have since become worse.  Other associated symptoms include headache, nasal congestion and discharge, nausea, post tussive emesis x 2, diarrhea x 1.  Patient states current medications including Benadryl and Claritin for relief as well as a cough suppressant prescribed by her primary care provider who she saw 1 month prior.  Pertinent negatives includes no dizziness, no changes in vision, no dysphagia, no shortness of breath, no hemoptysis, no chest pain or palpitations, no melena or hematochezia, no dysuria or hematuria, no muscle or joint aches. Pt denies recent travel, estrogen use, DVT hx, chest pain or SOB.   History reviewed. No pertinent past medical history.  Past Surgical History  Procedure Laterality Date  . Cesarean section    . Breast reduction surgery      2003  . Wisdom tooth extraction      History reviewed. No pertinent family history.  History  Substance Use Topics  . Smoking status: Never Smoker   . Smokeless tobacco: Never Used  . Alcohol Use: No    OB History   Grav Para Term Preterm Abortions TAB SAB Ect Mult Living   6 3 3  3 2 1   3       Review of Systems  All other systems reviewed and are negative.    Allergies  Aspirin  Home Medications   Current Outpatient Rx  Name  Route  Sig  Dispense  Refill  . diphenhydrAMINE (BENADRYL) 25 MG tablet   Oral   Take 25 mg by mouth every 6 (six) hours as needed for itching.         .  loratadine (CLARITIN) 10 MG tablet   Oral   Take 10 mg by mouth daily.           BP 160/98  Pulse 124  Temp(Src) 99.6 F (37.6 C) (Oral)  Resp 18  SpO2 99%  LMP 07/10/2012  Physical Exam Nursing note and vitals reviewed. Constitutional: She is oriented to person, place, and time. She appears well-developed and well-nourished.  Patient in mild distress.  HENT: Head: Normocephalic. Head is without raccoon's eyes, without Battle's sign, without contusion and without laceration.  Eyes: Conjunctivae and EOM are normal. Pupils are equal, round, and reactive to light. Nose: moist, mild erythema of turbinates, mild frontal and maxillary sinus tenderness to palpation Mouth: oral mucosa moist, non-erythematous, no ulcerations present Neck: Normal range of motion. Neck supple. No JVD present. No tracheal deviation present. No thyromegaly present.  Cardiovascular:  Tachycardic, regular rhythm, normal heart sounds and intact distal pulses.  Exam reveals no gallop and no friction rub.   No murmur heard. Pulmonary/Chest: Effort normal and breath sounds normal. No stridor. No respiratory distress. She has no wheezes. She has no rales. She exhibits no tenderness.  Abdominal: mild abdominal tenderness.  No rebound, no guarding.  Bowel sounds normal.  No distention and no mass Neurological: She is alert and oriented to person, place, and time. He exhibits  normal muscle tone. Coordination normal.  Skin: Skin is dry. No rash noted. No erythema. No pallor.  Psychiatric: She has a normal mood and affect. Her behavior is normal. Judgment and thought content normal.   ED Course  Procedures (including critical care time)  Labs Reviewed - No data to display Dg Chest 2 View  07/31/2012  *RADIOLOGY REPORT*  Clinical Data: 42 year old female with cough and fever.  CHEST - 2 VIEW  Comparison: 10/07/2008 and earlier.  Findings: Stable and normal lung volumes.  Cardiac size and mediastinal contours are within  normal limits.  Visualized tracheal air column is within normal limits.  No pneumothorax, pulmonary edema, pleural effusion or confluent pulmonary opacity. No acute osseous abnormality identified.  IMPRESSION: Negative, no acute cardiopulmonary abnormality.   Original Report Authenticated By: Erskine Speed, M.D.      No diagnosis found.  BP 160/98  Pulse 124  Temp(Src) 99.6 F (37.6 C) (Oral)  Resp 18  SpO2 99%  LMP 07/10/2012   MDM  Cough Patient 42 year old female who reports one month long of cough.  Imaging reviewed as above without acute abnormalities.  Patient advised to use albuterol inhaler, over-the-counter or allergy medication and cough suppressant.  Note that on arrival patient's heart rate was 124.  As well as criteria for pulmonary embolism was ordered at 1.5.  D-dimer has been ordered to rule out asthma is suspicion for pulmonary embolism is low.  Care resumed by oncoming provider [facility].  Case has been discussed w attending who agrees w plan thus far      Jaci Carrel, [place] 07/31/12 2004

## 2012-07-31 NOTE — ED Notes (Signed)
Patient reports having cough x3 weeks, went to PCP and was treated but symptoms returned. Reports N/V/D starting this week. Denies pain

## 2012-08-01 NOTE — ED Provider Notes (Signed)
  Medical screening examination/treatment/procedure(s) were performed by non-physician practitioner and as supervising physician I was immediately available for consultation/collaboration.    Gerhard Munch, MD 08/01/12 0003

## 2012-08-01 NOTE — ED Provider Notes (Signed)
  Medical screening examination/treatment/procedure(s) were performed by non-physician practitioner and as supervising physician I was immediately available for consultation/collaboration.    Gerhard Munch, MD 08/01/12 0002

## 2012-08-12 ENCOUNTER — Encounter (HOSPITAL_COMMUNITY): Payer: Self-pay | Admitting: *Deleted

## 2012-08-12 ENCOUNTER — Inpatient Hospital Stay (HOSPITAL_COMMUNITY)
Admission: AD | Admit: 2012-08-12 | Discharge: 2012-08-12 | Disposition: A | Discharge status: Home / Self Care | Payer: Self-pay | Source: Ambulatory Visit | Attending: Family Medicine | Admitting: Family Medicine

## 2012-08-12 DIAGNOSIS — Z3202 Encounter for pregnancy test, result negative: Secondary | ICD-10-CM | POA: Insufficient documentation

## 2012-08-12 DIAGNOSIS — R110 Nausea: Secondary | ICD-10-CM | POA: Insufficient documentation

## 2012-08-12 HISTORY — DX: Other specified postprocedural states: R11.2

## 2012-08-12 HISTORY — DX: Other specified postprocedural states: Z98.890

## 2012-08-12 LAB — URINALYSIS, ROUTINE W REFLEX MICROSCOPIC
Glucose, UA: NEGATIVE mg/dL
Nitrite: NEGATIVE
Specific Gravity, Urine: 1.02 (ref 1.005–1.030)
pH: 7 (ref 5.0–8.0)

## 2012-08-12 LAB — POCT PREGNANCY, URINE: Preg Test, Ur: NEGATIVE

## 2012-08-12 LAB — URINE MICROSCOPIC-ADD ON

## 2012-08-12 NOTE — MAU Note (Signed)
Patient states she has had nausea since she was seen at [facility] on 4-23. Not sure if it the medication she was given is causing it. Denies pain or bleeding.

## 2012-08-12 NOTE — MAU Provider Note (Signed)
History     CSN: [id number]  Arrival date and time: 08/12/12 1207   None     Chief Complaint  Patient presents with  . Possible Pregnancy  . Nausea   HPI 42 y.o. [id number] with nausea x 2 weeks, no vomiting. Taking cold medicine, unsure if this is causing nausea. Pt states she was concerned she might be pregnant. Patient's last menstrual period was 08/01/2012.   Past Medical History  Diagnosis Date  . PONV (postoperative nausea and vomiting)     Past Surgical History  Procedure Laterality Date  . Cesarean section    . Breast reduction surgery      2003  . Wisdom tooth extraction      History reviewed. No pertinent family history.  History  Substance Use Topics  . Smoking status: Never Smoker   . Smokeless tobacco: Never Used  . Alcohol Use: No    Allergies:  Allergies  Allergen Reactions  . Aspirin Nausea And Vomiting    Prescriptions prior to admission  Medication Sig Dispense Refill  . Chlorphen-Phenyltolox-PE-APAP (NOREL SR) 8-50-40-325 MG TB12 Take 1 tablet by mouth 4 (four) times daily.        Review of Systems  Constitutional: Negative.   HENT: Positive for congestion.   Respiratory: Negative.   Cardiovascular: Negative.   Gastrointestinal: Positive for nausea. Negative for vomiting, abdominal pain, diarrhea and constipation.  Genitourinary: Negative for dysuria, urgency, frequency, hematuria and flank pain.       Negative for vaginal bleeding, vaginal discharge, dyspareunia  Musculoskeletal: Negative.   Neurological: Negative.   Psychiatric/Behavioral: Negative.    Physical Exam   Blood pressure 142/95, pulse 88, temperature 98.6 F (37 C), temperature source Oral, resp. rate 18, height 5' 2.5" (1.588 m), weight 218 lb 12.8 oz (99.247 kg), last menstrual period 08/01/2012, SpO2 100.00%.  Physical Exam  Nursing note and vitals reviewed. Constitutional: She is oriented to person, place, and time. She appears well-developed and well-nourished.  No distress.  Obese   Cardiovascular: Normal rate.   Respiratory: Effort normal.  GI: Soft. She exhibits no distension and no mass. There is no tenderness. There is no rebound and no guarding.  Musculoskeletal: Normal range of motion.  Neurological: She is alert and oriented to person, place, and time.  Skin: Skin is warm and dry.  Psychiatric: She has a normal mood and affect.    MAU Course  Procedures Results for orders placed during the hospital encounter of 08/12/12 (from the past 24 hour(s))  URINALYSIS, ROUTINE W REFLEX MICROSCOPIC     Status: Abnormal   Collection Time    08/12/12 12:55 PM      Result Value Range   Color, Urine YELLOW  YELLOW   APPearance CLEAR  CLEAR   Specific Gravity, Urine 1.020  1.005 - 1.030   pH 7.0  5.0 - 8.0   Glucose, UA NEGATIVE  NEGATIVE mg/dL   Hgb urine dipstick NEGATIVE  NEGATIVE   Bilirubin Urine NEGATIVE  NEGATIVE   Ketones, ur NEGATIVE  NEGATIVE mg/dL   Protein, ur NEGATIVE  NEGATIVE mg/dL   Urobilinogen, UA 0.2  0.0 - 1.0 mg/dL   Nitrite NEGATIVE  NEGATIVE   Leukocytes, UA TRACE (*) NEGATIVE  URINE MICROSCOPIC-ADD ON     Status: Abnormal   Collection Time    08/12/12 12:55 PM      Result Value Range   Squamous Epithelial / LPF FEW (*) RARE   WBC, UA 3-6  <3 WBC/hpf  POCT  PREGNANCY, URINE     Status: None   Collection Time    08/12/12  1:06 PM      Result Value Range   Preg Test, Ur NEGATIVE  NEGATIVE     Assessment and Plan   1. Nausea alone   Has f/u appointment with PCP next week, no emergency medical condition today, UPT negative    Medication List    TAKE these medications       NOREL SR 8-50-40-325 MG Tb12  Generic drug:  Chlorphen-Phenyltolox-PE-APAP  Take 1 tablet by mouth 4 (four) times daily.            Follow-up Information   Follow up with your primary care doctor. (as scheduled)         FRAZIER,NATALIE 08/12/2012, 1:41 PM

## 2012-08-12 NOTE — MAU Provider Note (Signed)
Chart reviewed and agree with management and plan.  

## 2012-12-10 ENCOUNTER — Encounter (HOSPITAL_COMMUNITY): Payer: Self-pay | Admitting: Emergency Medicine

## 2012-12-10 ENCOUNTER — Emergency Department (HOSPITAL_COMMUNITY)
Admission: EM | Admit: 2012-12-10 | Discharge: 2012-12-10 | Disposition: A | Discharge status: Home / Self Care | Payer: BC Managed Care – PPO | Attending: Emergency Medicine | Admitting: Emergency Medicine

## 2012-12-10 ENCOUNTER — Emergency Department (HOSPITAL_COMMUNITY): Payer: BC Managed Care – PPO

## 2012-12-10 DIAGNOSIS — I10 Essential (primary) hypertension: Secondary | ICD-10-CM

## 2012-12-10 DIAGNOSIS — M79602 Pain in left arm: Secondary | ICD-10-CM

## 2012-12-10 DIAGNOSIS — R51 Headache: Secondary | ICD-10-CM | POA: Insufficient documentation

## 2012-12-10 DIAGNOSIS — M25519 Pain in unspecified shoulder: Secondary | ICD-10-CM | POA: Insufficient documentation

## 2012-12-10 DIAGNOSIS — R519 Headache, unspecified: Secondary | ICD-10-CM

## 2012-12-10 HISTORY — DX: Essential (primary) hypertension: I10

## 2012-12-10 LAB — BASIC METABOLIC PANEL
BUN: 8 mg/dL (ref 6–23)
CO2: 25 mEq/L (ref 19–32)
Chloride: 105 mEq/L (ref 96–112)
Creatinine, Ser: 0.64 mg/dL (ref 0.50–1.10)
GFR calc Af Amer: >90 mL/min (ref >90)
Glucose, Bld: 91 mg/dL (ref 70–99)
Potassium: 4 mEq/L (ref 3.5–5.1)

## 2012-12-10 LAB — CBC
HCT: 36.8 % (ref 36.0–46.0)
Hemoglobin: 13.1 g/dL (ref 12.0–15.0)
MCV: 76.8 fL — ABNORMAL LOW (ref 78.0–100.0)
RBC: 4.79 MIL/uL (ref 3.87–5.11)
WBC: 6.5 10*3/uL (ref 4.0–10.5)

## 2012-12-10 MED ORDER — ACETAMINOPHEN 325 MG PO TABS
650.0000 mg | ORAL_TABLET | Freq: Once | ORAL | Status: AC
  Administered 2012-12-10: 650 mg via ORAL
  Filled 2012-12-10: qty 2

## 2012-12-10 MED ORDER — HYDROCHLOROTHIAZIDE 25 MG PO TABS: 25.0000 mg | ORAL_TABLET | Freq: Every day | ORAL | Status: DC

## 2012-12-10 NOTE — ED Provider Notes (Signed)
CSN: [id number]     Arrival date & time 12/10/12  1344 History   First MD Initiated Contact with Patient 12/10/12 1758     Chief Complaint  Patient presents with  . Shoulder Pain  . Headache   (Consider location/radiation/quality/duration/timing/severity/associated sxs/prior Treatment) HPI Comments: 42 yo female with htn hx presents with left arm pain and HA.  HA intermittent for 2 days, gradual onset, similar to previous, no head injuries, no neck stiffness, improved in ED.  Left shoulder pain radiates down arm, worse with movement and palpation.  No weakness.  No cp or cardiac hx.  No recent surgery or blood clot hx.  Pt off htn meds for years.    Patient is a 42 y.o. female presenting with shoulder pain and headaches. The history is provided by the patient.  Shoulder Pain This is a recurrent problem. Associated symptoms include headaches. Pertinent negatives include no chest pain, no abdominal pain and no shortness of breath.  Headache Associated symptoms: no abdominal pain, no back pain, no fever, no neck pain, no neck stiffness and no vomiting     Past Medical History  Diagnosis Date  . PONV (postoperative nausea and vomiting)   . Hypertension    Past Surgical History  Procedure Laterality Date  . Cesarean section    . Breast reduction surgery      2003  . Wisdom tooth extraction     History reviewed. No pertinent family history. History  Substance Use Topics  . Smoking status: Never Smoker   . Smokeless tobacco: Never Used  . Alcohol Use: No   OB History   Grav Para Term Preterm Abortions TAB SAB Ect Mult Living   6 3 3  3 2 1   3      Review of Systems  Constitutional: Negative for fever and chills.  HENT: Negative for neck pain and neck stiffness.   Eyes: Negative for visual disturbance.  Respiratory: Negative for shortness of breath.   Cardiovascular: Negative for chest pain.  Gastrointestinal: Negative for vomiting and abdominal pain.  Genitourinary: Negative  for dysuria and flank pain.  Musculoskeletal: Positive for arthralgias. Negative for back pain.  Skin: Negative for rash.  Neurological: Positive for headaches. Negative for light-headedness.    Allergies  Aspirin  Home Medications   Current Outpatient Rx  Name  Route  Sig  Dispense  Refill  . aspirin-acetaminophen-caffeine (EXCEDRIN MIGRAINE) 250-250-65 MG per tablet   Oral   Take 1 tablet by mouth every 8 (eight) hours as needed for pain. For pain         . loratadine (CLARITIN) 10 MG tablet   Oral   Take 10 mg by mouth daily as needed for allergies. For allergy symptoms          BP 153/103  Pulse 85  Temp(Src) 98.4 F (36.9 C) (Oral)  Resp 18  Ht 5\' 3"  (1.6 m)  Wt 220 lb (99.791 kg)  BMI 38.98 kg/m2  SpO2 100%  LMP 12/06/2012 Physical Exam  Nursing note and vitals reviewed. Constitutional: She is oriented to person, place, and time. She appears well-developed and well-nourished.  HENT:  Head: Normocephalic and atraumatic.  Eyes: Conjunctivae are normal. Right eye exhibits no discharge. Left eye exhibits no discharge.  Neck: Normal range of motion. Neck supple. No tracheal deviation present.  Cardiovascular: Regular rhythm and intact distal pulses.   No murmur heard. Pulmonary/Chest: Effort normal and breath sounds normal.  Abdominal: Soft. She exhibits no distension. There is  no tenderness. There is no guarding.  Musculoskeletal: She exhibits tenderness (tender left lateral shoulder with palpation). She exhibits no edema.  Neurological: She is alert and oriented to person, place, and time. GCS eye subscore is 4. GCS verbal subscore is 5. GCS motor subscore is 6.  5+ strength in UE and LE with f/e at major joints. Sensation to palpation intact in UE and LE. CNs 2-12 grossly intact.  EOMFI.  PERRL.   Finger nose and coordination intact bilateral.   Visual fields intact to finger testing.   Skin: Skin is warm. No rash noted.  Psychiatric: She has a normal mood  and affect.    ED Course  Procedures (including critical care time) Labs Review Labs Reviewed  CBC - Abnormal; Notable for the following:    MCV 76.8 (*)    All other components within normal limits  BASIC METABOLIC PANEL  POCT I-STAT TROPONIN I   Imaging Review Dg Chest 2 View  12/10/2012   *RADIOLOGY REPORT*  Clinical Data: Pain and headache  CHEST - 2 VIEW  Comparison: July 31, 2012  Findings: Lungs clear.  Heart size and pulmonary vascularity are normal.  No adenopathy.  No pneumothorax.  No bone lesions.  IMPRESSION: No abnormality noted.   Original Report Authenticated By: Bretta Bang, M.D.    MDM  No diagnosis found. HA no concern for [facility] or meningitis.   Shoulder pain reproducable. Cardiac screen done for atypical pain. Close fup and return instructions given. Pt does not want pain medicines.  Her main reason for coming to ED is to make sure she was not having a stroke- normal neuro exam,  unlikley neuro with painful left arm and no weakness or sensory loss.  Well appearing, smiling.   Date: 12/10/2012  Rate: 99  Rhythm: normal sinus rhythm  QRS Axis: normal  Intervals: qt  ST/T Wave abnormalities: nonspecific ST changes  Conduction Disutrbances:none  Narrative Interpretation:   Old EKG Reviewed: changes noted  DC discussed. HTN fup discussed.    Enid Skeens, MD 12/10/12 1911

## 2012-12-10 NOTE — ED Notes (Signed)
Pt c/o left shoulder pain and HA today; pt sts hx of htn off meds x 3 years

## 2013-03-24 ENCOUNTER — Other Ambulatory Visit: Payer: Self-pay | Admitting: Family Medicine

## 2013-03-24 ENCOUNTER — Other Ambulatory Visit (HOSPITAL_COMMUNITY)
Admission: RE | Admit: 2013-03-24 | Discharge: 2013-03-24 | Disposition: A | Discharge status: Home / Self Care | Payer: BC Managed Care – PPO | Source: Ambulatory Visit | Attending: Family Medicine | Admitting: Family Medicine

## 2013-03-24 DIAGNOSIS — Z1151 Encounter for screening for human papillomavirus (HPV): Secondary | ICD-10-CM | POA: Insufficient documentation

## 2013-03-24 DIAGNOSIS — Z01419 Encounter for gynecological examination (general) (routine) without abnormal findings: Secondary | ICD-10-CM | POA: Insufficient documentation

## 2014-02-09 ENCOUNTER — Encounter (HOSPITAL_COMMUNITY): Payer: Self-pay | Admitting: Emergency Medicine

## 2014-03-10 ENCOUNTER — Emergency Department (HOSPITAL_COMMUNITY)
Admission: EM | Admit: 2014-03-10 | Discharge: 2014-03-10 | Disposition: A | Discharge status: Home / Self Care | Payer: BC Managed Care – PPO | Attending: Emergency Medicine | Admitting: Emergency Medicine

## 2014-03-10 ENCOUNTER — Encounter (HOSPITAL_COMMUNITY): Payer: Self-pay | Admitting: Emergency Medicine

## 2014-03-10 DIAGNOSIS — Z9889 Other specified postprocedural states: Secondary | ICD-10-CM | POA: Insufficient documentation

## 2014-03-10 DIAGNOSIS — Z79899 Other long term (current) drug therapy: Secondary | ICD-10-CM | POA: Diagnosis not present

## 2014-03-10 DIAGNOSIS — I10 Essential (primary) hypertension: Secondary | ICD-10-CM | POA: Insufficient documentation

## 2014-03-10 DIAGNOSIS — R1010 Upper abdominal pain, unspecified: Secondary | ICD-10-CM | POA: Diagnosis not present

## 2014-03-10 DIAGNOSIS — R1013 Epigastric pain: Secondary | ICD-10-CM | POA: Diagnosis not present

## 2014-03-10 LAB — CBC WITH DIFFERENTIAL/PLATELET
BASOS ABS: 0 10*3/uL (ref 0.0–0.1)
BASOS PCT: 0 % (ref 0–1)
EOS ABS: 0.4 10*3/uL (ref 0.0–0.7)
Eosinophils Relative: 5 % (ref 0–5)
HCT: 38.3 % (ref 36.0–46.0)
Hemoglobin: 12.8 g/dL (ref 12.0–15.0)
Lymphocytes Relative: 16 % (ref 12–46)
Lymphs Abs: 1.1 10*3/uL (ref 0.7–4.0)
MCH: 26 pg (ref 26.0–34.0)
MCHC: 33.4 g/dL (ref 30.0–36.0)
MCV: 77.7 fL — ABNORMAL LOW (ref 78.0–100.0)
MONO ABS: 0.9 10*3/uL (ref 0.1–1.0)
Monocytes Relative: 13 % — ABNORMAL HIGH (ref 3–12)
NEUTROS ABS: 4.5 10*3/uL (ref 1.7–7.7)
NEUTROS PCT: 66 % (ref 43–77)
Platelets: 331 10*3/uL (ref 150–400)
RBC: 4.93 MIL/uL (ref 3.87–5.11)
RDW: 14.7 % (ref 11.5–15.5)
WBC: 6.8 10*3/uL (ref 4.0–10.5)

## 2014-03-10 LAB — COMPREHENSIVE METABOLIC PANEL
ALBUMIN: 3.5 g/dL (ref 3.5–5.2)
ALK PHOS: 66 U/L (ref 39–117)
ALT: 12 U/L (ref 0–35)
ANION GAP: 14 (ref 5–15)
AST: 12 U/L (ref 0–37)
BUN: 7 mg/dL (ref 6–23)
CALCIUM: 9.5 mg/dL (ref 8.4–10.5)
CO2: 24 mEq/L (ref 19–32)
Chloride: 107 mEq/L (ref 96–112)
Creatinine, Ser: 0.79 mg/dL (ref 0.50–1.10)
GFR calc Af Amer: >90 mL/min (ref >90)
GFR calc non Af Amer: >90 mL/min (ref >90)
Glucose, Bld: 98 mg/dL (ref 70–99)
POTASSIUM: 3.9 meq/L (ref 3.7–5.3)
SODIUM: 145 meq/L (ref 137–147)
TOTAL PROTEIN: 7.7 g/dL (ref 6.0–8.3)
Total Bilirubin: <0.2 mg/dL — ABNORMAL LOW (ref 0.3–1.2)

## 2014-03-10 LAB — I-STAT TROPONIN, ED: Troponin i, poc: 0 ng/mL (ref 0.00–0.08)

## 2014-03-10 LAB — LIPASE, BLOOD: Lipase: 38 U/L (ref 11–59)

## 2014-03-10 MED ORDER — FAMOTIDINE 20 MG PO TABS: 20.0000 mg | ORAL_TABLET | Freq: Two times a day (BID) | ORAL | Status: DC

## 2014-03-10 MED ORDER — GI COCKTAIL ~~LOC~~
30.0000 mL | Freq: Once | ORAL | Status: DC
  Filled 2014-03-10: qty 30

## 2014-03-10 NOTE — Discharge Instructions (Signed)
pepcid twice a day. Avoid fatty or spicy foods. Your blood work is normal today. Follow up with primary care doctor for further evaluation.   Gastroesophageal Reflux Disease, Adult Gastroesophageal reflux disease (GERD) happens when acid from your stomach flows up into the esophagus. When acid comes in contact with the esophagus, the acid causes soreness (inflammation) in the esophagus. Over time, GERD may create small holes (ulcers) in the lining of the esophagus. CAUSES   Increased body weight. This puts pressure on the stomach, making acid rise from the stomach into the esophagus.  Smoking. This increases acid production in the stomach.  Drinking alcohol. This causes decreased pressure in the lower esophageal sphincter (valve or ring of muscle between the esophagus and stomach), allowing acid from the stomach into the esophagus.  Late evening meals and a full stomach. This increases pressure and acid production in the stomach.  A malformed lower esophageal sphincter. Sometimes, no cause is found. SYMPTOMS   Burning pain in the lower part of the mid-chest behind the breastbone and in the mid-stomach area. This may occur twice a week or more often.  Trouble swallowing.  Sore throat.  Dry cough.  Asthma-like symptoms including chest tightness, shortness of breath, or wheezing. DIAGNOSIS  Your caregiver may be able to diagnose GERD based on your symptoms. In some cases, X-rays and other tests may be done to check for complications or to check the condition of your stomach and esophagus. TREATMENT  Your caregiver may recommend over-the-counter or prescription medicines to help decrease acid production. Ask your caregiver before starting or adding any new medicines.  HOME CARE INSTRUCTIONS   Change the factors that you can control. Ask your caregiver for guidance concerning weight loss, quitting smoking, and alcohol consumption.  Avoid foods and drinks that make your symptoms worse,  such as:  Caffeine or alcoholic drinks.  Chocolate.  Peppermint or mint flavorings.  Garlic and onions.  Spicy foods.  Citrus fruits, such as oranges, lemons, or limes.  Tomato-based foods such as sauce, chili, salsa, and pizza.  Fried and fatty foods.  Avoid lying down for the 3 hours prior to your bedtime or prior to taking a nap.  Eat small, frequent meals instead of large meals.  Wear loose-fitting clothing. Do not wear anything tight around your waist that causes pressure on your stomach.  Raise the head of your bed 6 to 8 inches with wood blocks to help you sleep. Extra pillows will not help.  Only take over-the-counter or prescription medicines for pain, discomfort, or fever as directed by your caregiver.  Do not take aspirin, ibuprofen, or other nonsteroidal anti-inflammatory drugs (NSAIDs). SEEK IMMEDIATE MEDICAL CARE IF:   You have pain in your arms, neck, jaw, teeth, or back.  Your pain increases or changes in intensity or duration.  You develop nausea, vomiting, or sweating (diaphoresis).  You develop shortness of breath, or you faint.  Your vomit is green, yellow, black, or looks like coffee grounds or blood.  Your stool is red, bloody, or black. These symptoms could be signs of other problems, such as heart disease, gastric bleeding, or esophageal bleeding. MAKE SURE YOU:   Understand these instructions.  Will watch your condition.  Will get help right away if you are not doing well or get worse. Document Released: 01/04/2005 Document Revised: 06/19/2011 Document Reviewed: 10/14/2010 [facility] Patient Information 2015 [place], [place]. This information is not intended to replace advice given to you by your health care provider. Make sure you  discuss any questions you have with your health care provider. ° °

## 2014-03-10 NOTE — ED Notes (Signed)
Pt arrived to the ED with a complaint of intermittent abdominal pain. Pt states pain is located upper medial abdomen .  Pt states the pain has been coming and going for years but that in the last week the pain has been consistently intermittently present.  Pt denies N/V/D.  Last bowel movement was today.

## 2014-03-10 NOTE — ED Notes (Signed)
Pt refused EKG, states she would like to just go home. PA made aware and is currently at bedside.

## 2014-03-10 NOTE — ED Provider Notes (Signed)
CSN: [id number]     Arrival date & time 03/10/14  1848 History   First MD Initiated Contact with Patient 03/10/14 2121     Chief Complaint  Patient presents with  . Abdominal Pain     (Consider location/radiation/quality/duration/timing/severity/associated sxs/prior Treatment) HPI Kathy Harrison is a 43 y.o. female who presents to ED with complaining of epigastric abdominal pain. Patient states her pain has been intermittent for several weeks. States pain is only in the upper abdomen, does not radiate. No associated symptoms, no associated chest pain, shortness of breath, nausea, vomiting. Pain is not worsened by eating. States pain is only elicited by exertion. No medications tried prior to coming in. States today pain worsened, states when she is walking she has to bend over. When she stops walking and sits down pain improves. Denies any fever or chills. Has had similar episodes in the past, but never seen a provider. She denies any history of heart problems. She does have history of hypertension for which she takes medications.  Past Medical History  Diagnosis Date  . PONV (postoperative nausea and vomiting)   . Hypertension    Past Surgical History  Procedure Laterality Date  . Cesarean section    . Breast reduction surgery      2003  . Wisdom tooth extraction     History reviewed. No pertinent family history. History  Substance Use Topics  . Smoking status: Never Smoker   . Smokeless tobacco: Never Used  . Alcohol Use: No   OB History    Gravida Para Term Preterm AB TAB SAB Ectopic Multiple Living   6 3 3  3 2 1   3      Review of Systems  Constitutional: Negative for fever and chills.  Respiratory: Negative for cough, chest tightness and shortness of breath.   Cardiovascular: Negative for chest pain, palpitations and leg swelling.  Gastrointestinal: Positive for abdominal pain. Negative for nausea, vomiting, diarrhea, constipation, blood in stool and anal bleeding.   Genitourinary: Negative for dysuria, flank pain and pelvic pain.  Musculoskeletal: Negative for myalgias, arthralgias, neck pain and neck stiffness.  Skin: Negative for rash.  Neurological: Negative for dizziness, weakness and headaches.  All other systems reviewed and are negative.     Allergies  Aspirin  Home Medications   Prior to Admission medications   Medication Sig Start Date End Date Taking? Authorizing Provider  aspirin-acetaminophen-caffeine (EXCEDRIN MIGRAINE) [phone] MG per tablet Take 1 tablet by mouth every 8 (eight) hours as needed for pain. For pain   Yes Historical Provider, MD  loratadine (CLARITIN) 10 MG tablet Take 10 mg by mouth daily as needed for allergies. For allergy symptoms   Yes Historical Provider, MD  hydrochlorothiazide (HYDRODIURIL) 25 MG tablet Take 1 tablet (25 mg total) by mouth daily. 12/10/12   Enid SkeensJoshua M Zavitz, MD   BP 149/96 mmHg  Pulse 93  Temp(Src) 98 F (36.7 C) (Oral)  Resp 18  Ht 5\' 3"  (1.6 m)  Wt 220 lb (99.791 kg)  BMI 38.98 kg/m2  SpO2 100%  LMP 02/08/2014 (Approximate) Physical Exam  Constitutional: She appears well-developed and well-nourished. No distress.  HENT:  Head: Normocephalic.  Eyes: Conjunctivae are normal.  Neck: Neck supple.  Cardiovascular: Normal rate, regular rhythm and normal heart sounds.   Pulmonary/Chest: Effort normal and breath sounds normal. No respiratory distress. She has no wheezes. She has no rales.  Abdominal: Soft. Bowel sounds are normal. She exhibits no distension. There is tenderness. There is  no rebound.  Epigastric tenderness  Musculoskeletal: She exhibits no edema.  Neurological: She is alert.  Skin: Skin is warm and dry.  Psychiatric: She has a normal mood and affect. Her behavior is normal.  Nursing note and vitals reviewed.   ED Course  Procedures (including critical care time) Labs Review Labs Reviewed  CBC WITH DIFFERENTIAL - Abnormal; Notable for the following:    MCV 77.7  (*)    Monocytes Relative 13 (*)    All other components within normal limits  COMPREHENSIVE METABOLIC PANEL - Abnormal; Notable for the following:    Total Bilirubin <0.2 (*)    All other components within normal limits  LIPASE, BLOOD  I-STAT TROPOININ, ED    Imaging Review No results found.   EKG Interpretation   Date/Time:  Tuesday March 10 2014 22:09:26 EST Ventricular Rate:  81 PR Interval:  164 QRS Duration: 85 QT Interval:  386 QTC Calculation: 448 R Axis:   37 Text Interpretation:  Sinus rhythm No significant change since last  tracing Confirmed by KNAPP  MD-J, JON ([id number]) on 03/10/2014 10:15:46 PM      MDM   Final diagnoses:  Epigastric pain    Patient with epigastric pain for several weeks. Epigastric tenderness on exam. Labs already obtained prior to me seeing patient and are negative. Patient denies any chest pain. Her symptoms are concerning being that they are only exertional. Will get EKG and troponin. Patient is only medications at this time. No pain while resting in bed. No right upper quadrant pain or tenderness, doubt cholelithiasis, however still on differential.   Pt's ECG and troponin normal. Pt's symptoms resolved at this time. Pt requesting to be dc home. Will start on pepcid. Close follow up with PCP. Return precautions discussed. Question possible GERD.   Filed Vitals:   03/10/14 1935 03/10/14 2250  BP: 149/96 157/101  Pulse: 93 88  Temp: 98 F (36.7 C)   TempSrc: Oral   Resp: 18 16  Height: 5\' 3"  (1.6 m)   Weight: 220 lb (99.791 kg)   SpO2: 100% 100%     Lottie Musselatyana A Relena Ivancic, PA-C 03/11/14 0030  Linwood DibblesJon Knapp, MD 03/12/14 1304

## 2014-03-25 ENCOUNTER — Encounter (HOSPITAL_COMMUNITY): Payer: Self-pay | Admitting: Emergency Medicine

## 2014-03-25 ENCOUNTER — Emergency Department (HOSPITAL_COMMUNITY)
Admission: EM | Admit: 2014-03-25 | Discharge: 2014-03-25 | Disposition: A | Discharge status: Home / Self Care | Payer: BC Managed Care – PPO | Attending: Emergency Medicine | Admitting: Emergency Medicine

## 2014-03-25 DIAGNOSIS — M79602 Pain in left arm: Secondary | ICD-10-CM | POA: Diagnosis present

## 2014-03-25 DIAGNOSIS — M79622 Pain in left upper arm: Secondary | ICD-10-CM | POA: Insufficient documentation

## 2014-03-25 DIAGNOSIS — E876 Hypokalemia: Secondary | ICD-10-CM | POA: Insufficient documentation

## 2014-03-25 DIAGNOSIS — Z79899 Other long term (current) drug therapy: Secondary | ICD-10-CM | POA: Diagnosis not present

## 2014-03-25 DIAGNOSIS — I10 Essential (primary) hypertension: Secondary | ICD-10-CM | POA: Diagnosis not present

## 2014-03-25 DIAGNOSIS — Z7982 Long term (current) use of aspirin: Secondary | ICD-10-CM | POA: Insufficient documentation

## 2014-03-25 DIAGNOSIS — R202 Paresthesia of skin: Secondary | ICD-10-CM

## 2014-03-25 LAB — BASIC METABOLIC PANEL
Anion gap: 15 (ref 5–15)
BUN: 11 mg/dL (ref 6–23)
CALCIUM: 9.8 mg/dL (ref 8.4–10.5)
CO2: 27 mEq/L (ref 19–32)
Chloride: 97 mEq/L (ref 96–112)
Creatinine, Ser: 0.75 mg/dL (ref 0.50–1.10)
Glucose, Bld: 111 mg/dL — ABNORMAL HIGH (ref 70–99)
POTASSIUM: 3.2 meq/L — AB (ref 3.7–5.3)
SODIUM: 139 meq/L (ref 137–147)

## 2014-03-25 LAB — CBC WITH DIFFERENTIAL/PLATELET
BASOS ABS: 0 10*3/uL (ref 0.0–0.1)
BASOS PCT: 1 % (ref 0–1)
EOS ABS: 0.2 10*3/uL (ref 0.0–0.7)
EOS PCT: 3 % (ref 0–5)
HCT: 42.3 % (ref 36.0–46.0)
Hemoglobin: 14.4 g/dL (ref 12.0–15.0)
Lymphocytes Relative: 19 % (ref 12–46)
Lymphs Abs: 1.4 10*3/uL (ref 0.7–4.0)
MCH: 26.6 pg (ref 26.0–34.0)
MCHC: 34 g/dL (ref 30.0–36.0)
MCV: 78.2 fL (ref 78.0–100.0)
Monocytes Absolute: 0.6 10*3/uL (ref 0.1–1.0)
Monocytes Relative: 8 % (ref 3–12)
NEUTROS PCT: 69 % (ref 43–77)
Neutro Abs: 4.9 10*3/uL (ref 1.7–7.7)
PLATELETS: 374 10*3/uL (ref 150–400)
RBC: 5.41 MIL/uL — AB (ref 3.87–5.11)
RDW: 14.7 % (ref 11.5–15.5)
WBC: 7.1 10*3/uL (ref 4.0–10.5)

## 2014-03-25 LAB — D-DIMER, QUANTITATIVE (NOT AT ARMC)

## 2014-03-25 LAB — I-STAT TROPONIN, ED: TROPONIN I, POC: 0 ng/mL (ref 0.00–0.08)

## 2014-03-25 MED ORDER — NAPROXEN 500 MG PO TABS: 500.0000 mg | ORAL_TABLET | Freq: Two times a day (BID) | ORAL | Status: DC | PRN

## 2014-03-25 MED ORDER — POTASSIUM CHLORIDE CRYS ER 20 MEQ PO TBCR
40.0000 meq | EXTENDED_RELEASE_TABLET | Freq: Once | ORAL | Status: AC
  Administered 2014-03-25: 40 meq via ORAL
  Filled 2014-03-25: qty 2

## 2014-03-25 MED ORDER — POTASSIUM CHLORIDE CRYS ER 20 MEQ PO TBCR: 20.0000 meq | EXTENDED_RELEASE_TABLET | Freq: Every day | ORAL | Status: DC

## 2014-03-25 NOTE — Discharge Instructions (Signed)
Your pain could be caused by repetitive motion or improper positioning while at work. Your potassium was slightly low, take Kdur as directed. Stay well hydrated. Use heat for 20 minutes every hour for any pain. Use antiinflammatories such as naprosyn for pain. See your regular doctor for ongoing management of your symptoms. Return to the ER for changes or worsening in your symptoms.   Musculoskeletal Pain Musculoskeletal pain is muscle and boney aches and pains. These pains can occur in any part of the body. Your caregiver may treat you without knowing the cause of the pain. They may treat you if blood or urine tests, X-rays, and other tests were normal.  CAUSES There is often not a definite cause or reason for these pains. These pains may be caused by a type of germ (virus). The discomfort may also come from overuse. Overuse includes working out too hard when your body is not fit. Boney aches also come from weather changes. Bone is sensitive to atmospheric pressure changes. HOME CARE INSTRUCTIONS   Ask when your test results will be ready. Make sure you get your test results.  Only take over-the-counter or prescription medicines for pain, discomfort, or fever as directed by your caregiver. If you were given medications for your condition, do not drive, operate machinery or power tools, or sign legal documents for 24 hours. Do not drink alcohol. Do not take sleeping pills or other medications that may interfere with treatment.  Continue all activities unless the activities cause more pain. When the pain lessens, slowly resume normal activities. Gradually increase the intensity and duration of the activities or exercise.  During periods of severe pain, bed rest may be helpful. Lay or sit in any position that is comfortable.  Putting ice on the injured area.  Put ice in a bag.  Place a towel between your skin and the bag.  Leave the ice on for 15 to 20 minutes, 3 to 4 times a day.  Follow up  with your caregiver for continued problems and no reason can be found for the pain. If the pain becomes worse or does not go away, it may be necessary to repeat tests or do additional testing. Your caregiver may need to look further for a possible cause. SEEK IMMEDIATE MEDICAL CARE IF:  You have pain that is getting worse and is not relieved by medications.  You develop chest pain that is associated with shortness or breath, sweating, feeling sick to your stomach (nauseous), or throw up (vomit).  Your pain becomes localized to the abdomen.  You develop any new symptoms that seem different or that concern you. MAKE SURE YOU:   Understand these instructions.  Will watch your condition.  Will get help right away if you are not doing well or get worse. Document Released: 03/27/2005 Document Revised: 06/19/2011 Document Reviewed: 11/29/2012 [facility] Patient Information 2015 [place], [place]. This information is not intended to replace advice given to you by your health care provider. Make sure you discuss any questions you have with your health care provider.  Paresthesia Paresthesia is an abnormal burning or prickling sensation. This sensation is generally felt in the hands, arms, legs, or feet. However, it may occur in any part of the body. It is usually not painful. The feeling may be described as:  Tingling or numbness.  "Pins and needles."  Skin crawling.  Buzzing.  Limbs "falling asleep."  Itching. Most people experience temporary (transient) paresthesia at some time in their lives. CAUSES  Paresthesia  may occur when you breathe too quickly (hyperventilation). It can also occur without any apparent cause. Commonly, paresthesia occurs when pressure is placed on a nerve. The feeling quickly goes away once the pressure is removed. For some people, however, paresthesia is a long-lasting (chronic) condition caused by an underlying disorder. The underlying disorder may be:  A  traumatic, direct injury to nerves. Examples include a:  Broken (fractured) neck.  Fractured skull.  A disorder affecting the brain and spinal cord (central nervous system). Examples include:  Transverse myelitis.  Encephalitis.  Transient ischemic attack.  Multiple sclerosis.  Stroke.  Tumor or blood vessel problems, such as an arteriovenous malformation pressing against the brain or spinal cord.  A condition that damages the peripheral nerves (peripheral neuropathy). Peripheral nerves are not part of the brain and spinal cord. These conditions include:  Diabetes.  Peripheral vascular disease.  Nerve entrapment syndromes, such as carpal tunnel syndrome.  Shingles.  Hypothyroidism.  Vitamin B12 deficiencies.  Alcoholism.  Heavy metal poisoning (lead, arsenic).  Rheumatoid arthritis.  Systemic lupus erythematosus. DIAGNOSIS  Your caregiver will attempt to find the underlying cause of your paresthesia. Your caregiver may:  Take your medical history.  Perform a physical exam.  Order various lab tests.  Order imaging tests. TREATMENT  Treatment for paresthesia depends on the underlying cause. HOME CARE INSTRUCTIONS  Avoid drinking alcohol.  You may consider massage or acupuncture to help relieve your symptoms.  Keep all follow-up appointments as directed by your caregiver. SEEK IMMEDIATE MEDICAL CARE IF:   You feel weak.  You have trouble walking or moving.  You have problems with speech or vision.  You feel confused.  You cannot control your bladder or bowel movements.  You feel numbness after an injury.  You faint.  Your burning or prickling feeling gets worse when walking.  You have pain, cramps, or dizziness.  You develop a rash. MAKE SURE YOU:  Understand these instructions.  Will watch your condition.  Will get help right away if you are not doing well or get worse. Document Released: 03/17/2002 Document Revised: 06/19/2011  Document Reviewed: 12/16/2010 [facility] Patient Information 2015 [place]. This information is not intended to replace advice given to you by your health care provider. Make sure you discuss any questions you have with your health care provider.  Hypokalemia Hypokalemia means that the amount of potassium in the blood is lower than normal.Potassium is a chemical, called an electrolyte, that helps regulate the amount of fluid in the body. It also stimulates muscle contraction and helps nerves function properly.Most of the body's potassium is inside of cells, and only a very small amount is in the blood. Because the amount in the blood is so small, minor changes can be life-threatening. CAUSES  Antibiotics.  Diarrhea or vomiting.  Using laxatives too much, which can cause diarrhea.  Chronic kidney disease.  Water pills (diuretics).  Eating disorders (bulimia).  Low magnesium level.  Sweating a lot. SIGNS AND SYMPTOMS  Weakness.  Constipation.  Fatigue.  Muscle cramps.  Mental confusion.  Skipped heartbeats or irregular heartbeat (palpitations).  Tingling or numbness. DIAGNOSIS  Your health care provider can diagnose hypokalemia with blood tests. In addition to checking your potassium level, your health care provider may also check other lab tests. TREATMENT Hypokalemia can be treated with potassium supplements taken by mouth or adjustments in your current medicines. If your potassium level is very low, you may need to get potassium through a vein (IV) and be  monitored in the hospital. A diet high in potassium is also helpful. Foods high in potassium are:  Nuts, such as peanuts and pistachios.  Seeds, such as sunflower seeds and pumpkin seeds.  Peas, lentils, and lima beans.  Whole grain and bran cereals and breads.  Fresh fruit and vegetables, such as apricots, avocado, bananas, cantaloupe, kiwi, oranges, tomatoes, asparagus, and potatoes.  Orange and tomato  juices.  Red meats.  Fruit yogurt. HOME CARE INSTRUCTIONS  Take all medicines as prescribed by your health care provider.  Maintain a healthy diet by including nutritious food, such as fruits, vegetables, nuts, whole grains, and lean meats.  If you are taking a laxative, be sure to follow the directions on the label. SEEK MEDICAL CARE IF:  Your weakness gets worse.  You feel your heart pounding or racing.  You are vomiting or having diarrhea.  You are diabetic and having trouble keeping your blood glucose in the normal range. SEEK IMMEDIATE MEDICAL CARE IF:  You have chest pain, shortness of breath, or dizziness.  You are vomiting or having diarrhea for more than 2 days.  You faint. MAKE SURE YOU:   Understand these instructions.  Will watch your condition.  Will get help right away if you are not doing well or get worse. Document Released: 03/27/2005 Document Revised: 01/15/2013 Document Reviewed: 09/27/2012 [facility] Patient Information 2015 [place], [place]. This information is not intended to replace advice given to you by your health care provider. Make sure you discuss any questions you have with your health care provider.

## 2014-03-25 NOTE — ED Notes (Signed)
Pt c/o lt elbow pain since yesterday.  States that pain is intermittent and is not having pain now.  States pain is from elbow to shoulder.  Denies any chest or back pain. Denies cardiac hx besides HTN.  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

## 2014-03-25 NOTE — ED Provider Notes (Signed)
CSN: [id number]     Arrival date & time 03/25/14  1114 History   First MD Initiated Contact with Patient 03/25/14 1153     Chief Complaint  Patient presents with  . Arm Pain     (Consider location/radiation/quality/duration/timing/severity/associated sxs/prior Treatment) HPI Comments: Kathy Harrison is a 43 y.o. female with a PMHx of HTN, who presents to the ED with complaints of intermittent L arm pain that occurred yesterday around 4pm after work, and then resolved spontaneously. At work today around [employer]9am, it returned. She took her BP medication but the pain continued to come intermittently without known eliciting cause. Pain described as 10/10 squeezing tightness over lateral aspect of upper L arm, radiating into shoulder/deltoid region, with no known aggravating or alleviating factors, resolving spontaneously. Pt states that when it occurred, she was at rest. States that this time it's resolved fully. Associated symptoms include intermittent L hand paresthesia which is currently resolved. Denies fevers, chills, tinnitus, lightheadedness, dizziness, HA, vision changes, DOE, chest tightness or pressure, jaw or back/neck pain, abd pain, nausea/vomiting, or diaphoresis, CP, SOB, diarrhea, constipation, obstipation, melena, hematochezia, hematuria, dysuria, vaginal symptoms, weakness, numbness, color changes, cold extremities, or rashes, LE swelling, arm swelling or erythema, hx of DVT/PE, recent travel or immobilization, estrogen use, or IVDU. 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 +Stress from work.   Patient is a 43 y.o. female presenting with extremity pain. The history is provided by the patient. No language interpreter was used.  Extremity Pain This is a new problem. The current episode started yesterday. The problem occurs intermittently. The problem has been resolved. Associated symptoms include  arthralgias (LUE) and myalgias (LUE, along deltoid region). Pertinent negatives include no abdominal pain, change in bowel habit, chest pain, chills, coughing, diaphoresis, fever, headaches, joint swelling, nausea, neck pain, numbness, rash, vertigo, visual change, vomiting or weakness. Nothing aggravates the symptoms. She has tried nothing for the symptoms. The treatment provided no relief.    Past Medical History  Diagnosis Date  . PONV (postoperative nausea and vomiting)   . Hypertension    Past Surgical History  Procedure Laterality Date  . Cesarean section    . Breast reduction surgery      2003  . Wisdom tooth extraction     No family history on file. History  Substance Use Topics  . Smoking status: Never Smoker   . Smokeless tobacco: Never Used  . Alcohol Use: No   OB History    Gravida Para Term Preterm AB TAB SAB Ectopic Multiple Living   6 3 3  3 2 1   3      Review of Systems  Constitutional: Negative for fever, chills and diaphoresis.  Eyes: Negative for visual disturbance.  Respiratory: Negative for cough and chest tightness.   Cardiovascular: Negative for chest pain, palpitations and leg swelling.  Gastrointestinal: Negative for nausea, vomiting, abdominal pain, diarrhea, constipation, blood in stool and change in bowel habit.  Genitourinary: Negative for dysuria and hematuria.  Musculoskeletal: Positive for myalgias (LUE, along deltoid region) and arthralgias (LUE). Negative for back pain, joint swelling and neck pain.  Skin: Negative for rash.  Neurological: Negative for dizziness, vertigo, syncope, weakness, light-headedness, numbness and headaches.  Psychiatric/Behavioral: Negative for confusion.   10 Systems reviewed and are negative for acute change except as noted in the HPI.    Allergies  Aspirin  Home Medications   Prior to Admission  medications   Medication Sig Start Date End Date Taking? Authorizing Provider  aspirin-acetaminophen-caffeine  (EXCEDRIN MIGRAINE) [phone] MG per tablet Take 1 tablet by mouth every 8 (eight) hours as needed for pain.    Yes Historical Provider, MD  esomeprazole (NEXIUM) 20 MG capsule Take 20 mg by mouth daily as needed (heartburn).   Yes Historical Provider, MD  loratadine (CLARITIN) 10 MG tablet Take 10 mg by mouth daily as needed for allergies. For allergy symptoms   Yes Historical Provider, MD  olmesartan (BENICAR) 20 MG tablet Take 20 mg by mouth every morning.   Yes Historical Provider, MD  famotidine (PEPCID) 20 MG tablet Take 1 tablet (20 mg total) by mouth 2 (two) times daily. Patient not taking: Reported on 03/25/2014 03/10/14   Tatyana A Kirichenko, PA-C  hydrochlorothiazide (HYDRODIURIL) 25 MG tablet Take 1 tablet (25 mg total) by mouth daily. Patient not taking: Reported on 03/25/2014 12/10/12   Enid SkeensJoshua M Zavitz, MD   BP 127/89 mmHg  Pulse 103  Temp(Src) 98.2 F (36.8 C) (Oral)  Resp 18  SpO2 100%  LMP 02/08/2014 (Approximate) Physical Exam  Constitutional: She is oriented to person, place, and time. She appears well-developed and well-nourished.  Non-toxic appearance. No distress.  Initially tachycardic which resolved, BP WNL, VSS, afebrile and nontoxic. NAD.  HENT:  Head: Normocephalic and atraumatic.  Mouth/Throat: Oropharynx is clear and moist and mucous membranes are normal.  Eyes: Conjunctivae and EOM are normal. Right eye exhibits no discharge. Left eye exhibits no discharge.  Neck: Normal range of motion. Neck supple. No spinous process tenderness and no muscular tenderness present. No rigidity. Normal range of motion present.  FROM intact without spinous process or paraspinous muscle TTP, no bony stepoffs or deformities, no muscle spasms. No rigidity or meningeal signs. No bruising or swelling.   Cardiovascular: Normal rate, regular rhythm, normal heart sounds and intact distal pulses.  Exam reveals no gallop and no friction rub.   No murmur heard. Initially tachycardic which  resolved. RRR, nl s1/s2, no m/r/g, distal pulses equal in all extremities  Pulmonary/Chest: Effort normal and breath sounds normal. No respiratory distress. She has no decreased breath sounds. She has no wheezes. She has no rhonchi. She has no rales. She exhibits no tenderness, no crepitus and no deformity.  CTAB in all lung fields, no chest wall deformity/crepitus or TTP  Abdominal: Soft. Normal appearance and bowel sounds are normal. She exhibits no distension. There is no tenderness. There is no rigidity, no rebound, no guarding, no CVA tenderness, no tenderness at McBurney's point and negative Murphy's sign.  Soft, NTND, +BS throughout, no r/g/r, neg murphy's, neg mcburney's, no CVA TTP   Musculoskeletal: Normal range of motion.       Left upper arm: She exhibits tenderness. She exhibits no bony tenderness, no swelling, no edema and no deformity.  Mild TTP to lateral aspect of LUE along deltoid muscle, no edema or swelling, no skin color changes, no deformity or bony TTP. No shoulder or elbow joint TTP, FROM intact. Distal pulses intact, strength 5/5 in all extremities, sensation grossly intact in all extremities.  Neurological: She is alert and oriented to person, place, and time. She has normal strength. No sensory deficit. Gait normal.  Skin: Skin is warm, dry and intact. No rash noted.  No bruising or skin discolorations  Psychiatric: She has a normal mood and affect.  Nursing note and vitals reviewed.   ED Course  Procedures (including critical care time) Labs Review Labs  Reviewed  CBC WITH DIFFERENTIAL - Abnormal; Notable for the following:    RBC 5.41 (*)    All other components within normal limits  BASIC METABOLIC PANEL - Abnormal; Notable for the following:    Potassium 3.2 (*)    Glucose, Bld 111 (*)    All other components within normal limits  D-DIMER, QUANTITATIVE  I-STAT TROPOININ, ED    Imaging Review No results found.   EKG Interpretation None    EKG:  NSR  MDM   Final diagnoses:  Left upper arm pain  Paresthesia of arm  Hypokalemia    44 y.o. female with L arm pain x1 day, intermittent and currently resolved. L hand paresthesia intermittent, currently resolved. Hx of HTN. BPs and distal pulses equal in both arms, all pulses equal and strong, no obvious swelling or skin discoloration, no concerning features for DVT. Pt initially tachycardic and although no CP/SOB, will obtain basic labs and trop/dimer to r/o silent MI or PE. Given low risk, if dimer is neg doubt need for CTA. Neurovascularly intact with soft compartments. Declines pain meds. Will reassess shortly.   1:25 PM Trop neg, EKG in NSR. Dimer neg. CBC w/diff unremarkable. BMP showing mildly low potassium, will replete orally. I believe her pain is likely musculoskeletal. Discussed use of heat and NSAIDs for pain. Pt has a PCP appt next week, discussed f/up with her for ongoing management of any further pain. Will rx kdur x3 days. Discussed stress reduction and proper body mechanics at work. I explained the diagnosis and have given explicit precautions to return to the ER including for any other new or worsening symptoms. The patient understands and accepts the medical plan as it's been dictated and I have answered their questions. Discharge instructions concerning home care and prescriptions have been given. The patient is STABLE and is discharged to home in good condition.  BP 101/50 mmHg  Pulse 88  Temp(Src) 98.2 F (36.8 C) (Oral)  Resp 26  SpO2 98%  LMP 02/08/2014 (Approximate)  Meds ordered this encounter  Medications  . potassium chloride SA (K-DUR,KLOR-CON) CR tablet 40 mEq    Sig:   . potassium chloride SA (K-DUR,KLOR-CON) 20 MEQ tablet    Sig: Take 1 tablet (20 mEq total) by mouth daily.    Dispense:  3 tablet    Refill:  0    Order Specific Question:  Supervising Provider    Answer:  Eber Hong D [[id number]]  . naproxen (NAPROSYN) 500 MG tablet    Sig: Take 1  tablet (500 mg total) by mouth 2 (two) times daily as needed for mild pain, moderate pain or headache (TAKE WITH MEALS.).    Dispense:  20 tablet    Refill:  0    Order Specific Question:  Supervising Provider    Answer:  Eber Hong D [[id number]]       Donnita Falls Camprubi-Soms, PA-C 03/25/14 1339  Juliet Rude. Rubin Payor, MD 03/26/14 [phone]

## 2015-02-08 ENCOUNTER — Ambulatory Visit (INDEPENDENT_AMBULATORY_CARE_PROVIDER_SITE_OTHER): Payer: BLUE CROSS/BLUE SHIELD | Admitting: Emergency Medicine

## 2015-02-08 VITALS — BP 151/106 | HR 88 | Temp 97.6°F | Resp 16 | Ht 63.0 in | Wt 230.2 lb

## 2015-02-08 DIAGNOSIS — Z1239 Encounter for other screening for malignant neoplasm of breast: Secondary | ICD-10-CM | POA: Diagnosis not present

## 2015-02-08 DIAGNOSIS — Z0000 Encounter for general adult medical examination without abnormal findings: Secondary | ICD-10-CM

## 2015-02-08 LAB — POC MICROSCOPIC URINALYSIS (UMFC): MUCUS RE: ABSENT

## 2015-02-08 LAB — POCT URINALYSIS DIP (MANUAL ENTRY)
BILIRUBIN UA: NEGATIVE
Bilirubin, UA: NEGATIVE
Glucose, UA: NEGATIVE
Nitrite, UA: NEGATIVE
PH UA: 6
Protein Ur, POC: NEGATIVE
SPEC GRAV UA: 1.015
Urobilinogen, UA: 0.2

## 2015-02-08 MED ORDER — LISINOPRIL-HYDROCHLOROTHIAZIDE 20-12.5 MG PO TABS: 1.0000 | ORAL_TABLET | Freq: Every day | ORAL | Status: DC

## 2015-02-08 NOTE — Patient Instructions (Signed)
Hypertension Hypertension, commonly called high blood pressure, is when the force of blood pumping through your arteries is too strong. Your arteries are the blood vessels that carry blood from your heart throughout your body. A blood pressure reading consists of a higher number over a lower number, such as 110/72. The higher number (systolic) is the pressure inside your arteries when your heart pumps. The lower number (diastolic) is the pressure inside your arteries when your heart relaxes. Ideally you want your blood pressure below 120/80. Hypertension forces your heart to work harder to pump blood. Your arteries may become narrow or stiff. Having untreated or uncontrolled hypertension can cause heart attack, stroke, kidney disease, and other problems. RISK FACTORS Some risk factors for high blood pressure are controllable. Others are not.  Risk factors you cannot control include:   Race. You may be at higher risk if you are African American.  Age. Risk increases with age.  Gender. Men are at higher risk than women before age 45 years. After age 65, women are at higher risk than men. Risk factors you can control include:  Not getting enough exercise or physical activity.  Being overweight.  Getting too much fat, sugar, calories, or salt in your diet.  Drinking too much alcohol. SIGNS AND SYMPTOMS Hypertension does not usually cause signs or symptoms. Extremely high blood pressure (hypertensive crisis) may cause headache, anxiety, shortness of breath, and nosebleed. DIAGNOSIS To check if you have hypertension, your health care provider will measure your blood pressure while you are seated, with your arm held at the level of your heart. It should be measured at least twice using the same arm. Certain conditions can cause a difference in blood pressure between your right and left arms. A blood pressure reading that is higher than normal on one occasion does not mean that you need treatment. If  it is not clear whether you have high blood pressure, you may be asked to return on a different day to have your blood pressure checked again. Or, you may be asked to monitor your blood pressure at home for 1 or more weeks. TREATMENT Treating high blood pressure includes making lifestyle changes and possibly taking medicine. Living a healthy lifestyle can help lower high blood pressure. You may need to change some of your habits. Lifestyle changes may include:  Following the DASH diet. This diet is high in fruits, vegetables, and whole grains. It is low in salt, red meat, and added sugars.  Keep your sodium intake below 2,300 mg per day.  Getting at least 30-45 minutes of aerobic exercise at least 4 times per week.  Losing weight if necessary.  Not smoking.  Limiting alcoholic beverages.  Learning ways to reduce stress. Your health care provider may prescribe medicine if lifestyle changes are not enough to get your blood pressure under control, and if one of the following is true:  You are 18-59 years of age and your systolic blood pressure is above 140.  You are 60 years of age or older, and your systolic blood pressure is above 150.  Your diastolic blood pressure is above 90.  You have diabetes, and your systolic blood pressure is over 140 or your diastolic blood pressure is over 90.  You have kidney disease and your blood pressure is above 140/90.  You have heart disease and your blood pressure is above 140/90. Your personal target blood pressure may vary depending on your medical conditions, your age, and other factors. HOME CARE INSTRUCTIONS    Have your blood pressure rechecked as directed by your health care provider.   Take medicines only as directed by your health care provider. Follow the directions carefully. Blood pressure medicines must be taken as prescribed. The medicine does not work as well when you skip doses. Skipping doses also puts you at risk for  problems.  Do not smoke.   Monitor your blood pressure at home as directed by your health care provider. SEEK MEDICAL CARE IF:   You think you are having a reaction to medicines taken.  You have recurrent headaches or feel dizzy.  You have swelling in your ankles.  You have trouble with your vision. SEEK IMMEDIATE MEDICAL CARE IF:  You develop a severe headache or confusion.  You have unusual weakness, numbness, or feel faint.  You have severe chest or abdominal pain.  You vomit repeatedly.  You have trouble breathing. MAKE SURE YOU:   Understand these instructions.  Will watch your condition.  Will get help right away if you are not doing well or get worse.   This information is not intended to replace advice given to you by your health care provider. Make sure you discuss any questions you have with your health care provider.   Document Released: 03/27/2005 Document Revised: 08/11/2014 Document Reviewed: 01/17/2013 Elsevier Interactive Patient Education 2016 [employer] Inc.  

## 2015-02-08 NOTE — Progress Notes (Signed)
Subjective:  Patient ID: Kathy Harrison, female    DOB: 11-16-1970  Age: 44 y.o. MRN: [id number]  CC: Annual Exam and Gynecologic Exam   HPI Kathy Harrison presents  for a visit to refill her medication. She also requested physical examination. She is up-to-date on pelvic examination and Pap smear and has not had a mammogram in several years. He is out of her medication and has no acute complaint. She is eating and drinking and getting around without any adverse effects she is working full-time  History Kathy Harrison has a past medical history of PONV (postoperative nausea and vomiting) and Hypertension.   She has past surgical history that includes Cesarean section; Breast reduction surgery; and Wisdom tooth extraction.   Her  family history is not on file.  She   reports that she has never smoked. She has never used smokeless tobacco. She reports that she does not drink alcohol or use illicit drugs.  Outpatient Prescriptions Prior to Visit  Medication Sig Dispense Refill  . aspirin-acetaminophen-caffeine (EXCEDRIN MIGRAINE) 250-250-65 MG per tablet Take 1 tablet by mouth every 8 (eight) hours as needed for pain.     Marland Kitchen. esomeprazole (NEXIUM) 20 MG capsule Take 20 mg by mouth daily as needed (heartburn).    . famotidine (PEPCID) 20 MG tablet Take 1 tablet (20 mg total) by mouth 2 (two) times daily. (Patient not taking: Reported on 03/25/2014) 30 tablet 0  . hydrochlorothiazide (HYDRODIURIL) 25 MG tablet Take 1 tablet (25 mg total) by mouth daily. (Patient not taking: Reported on 03/25/2014) 30 tablet 0  . loratadine (CLARITIN) 10 MG tablet Take 10 mg by mouth daily as needed for allergies. For allergy symptoms    . naproxen (NAPROSYN) 500 MG tablet Take 1 tablet (500 mg total) by mouth 2 (two) times daily as needed for mild pain, moderate pain or headache (TAKE WITH MEALS.). (Patient not taking: Reported on 02/08/2015) 20 tablet 0  . olmesartan (BENICAR) 20 MG tablet Take 20 mg by mouth every  morning.    . potassium chloride SA (K-DUR,KLOR-CON) 20 MEQ tablet Take 1 tablet (20 mEq total) by mouth daily. (Patient not taking: Reported on 02/08/2015) 3 tablet 0   No facility-administered medications prior to visit.    Social History   Social History  . Marital Status: Single    Spouse Name: N/A  . Number of Children: N/A  . Years of Education: N/A   Social History Main Topics  . Smoking status: Never Smoker   . Smokeless tobacco: Never Used  . Alcohol Use: No  . Drug Use: No  . Sexual Activity: Not Currently   Other Topics Concern  . None   Social History Narrative     Review of Systems  Constitutional: Negative for fever, chills and appetite change.  HENT: Negative for congestion, ear pain, postnasal drip, sinus pressure and sore throat.   Eyes: Negative for pain and redness.  Respiratory: Negative for cough, shortness of breath and wheezing.   Cardiovascular: Negative for leg swelling.  Gastrointestinal: Negative for nausea, vomiting, abdominal pain, diarrhea, constipation and blood in stool.  Endocrine: Negative for polyuria.  Genitourinary: Negative for dysuria, urgency, frequency and flank pain.  Musculoskeletal: Negative for gait problem.  Skin: Negative for rash.  Neurological: Negative for weakness and headaches.  Psychiatric/Behavioral: Negative for confusion and decreased concentration. The patient is not nervous/anxious.     Objective:  BP 151/106 mmHg  Pulse 88  Temp(Src) 97.6 F (36.4 C) (Oral)  Resp 16  Ht  (1.6 m)  Wt 230 lb 3.2 oz (104.418 kg)  BMI 40.79 kg/m2  SpO2 99%  LMP 01/18/2015  Physical Exam  Constitutional: She is oriented to person, place, and time. She appears well-developed and well-nourished. No distress.  HENT:  Head: Normocephalic and atraumatic.  Right Ear: External ear normal.  Left Ear: External ear normal.  Nose: Nose normal.  Eyes: Conjunctivae and EOM are normal. Pupils are equal, round, and reactive to  light. No scleral icterus.  Neck: Normal range of motion. Neck supple. No tracheal deviation present.  Cardiovascular: Normal rate, regular rhythm and normal heart sounds.   Pulmonary/Chest: Effort normal. No respiratory distress. She has no wheezes. She has no rales.  Abdominal: She exhibits no mass. There is no tenderness. There is no rebound and no guarding.  Musculoskeletal: She exhibits no edema.  Lymphadenopathy:    She has no cervical adenopathy.  Neurological: She is alert and oriented to person, place, and time. Coordination normal.  Skin: Skin is warm and dry. No rash noted.  Psychiatric: She has a normal mood and affect. Her behavior is normal.      Assessment & Plan:   Kathy Harrison was seen today for annual exam and gynecologic exam.  Diagnoses and all orders for this visit:  Annual physical exam -     CBC -     Comprehensive metabolic panel -     Lipid panel -     TSH -     POCT urinalysis dipstick -     POCT Microscopic Urinalysis ([facility]) -     MM Digital Screening; Future  Breast cancer screening -     CBC -     Comprehensive metabolic panel -     Lipid panel -     TSH -     POCT urinalysis dipstick -     POCT Microscopic Urinalysis ([facility]) -     MM Digital Screening; Future  Other orders -     lisinopril-hydrochlorothiazide (ZESTORETIC) 20-12.5 MG tablet; Take 1 tablet by mouth daily.  I am having Kathy Harrison start on lisinopril-hydrochlorothiazide. I am also having her maintain her loratadine, aspirin-acetaminophen-caffeine, hydrochlorothiazide, famotidine, olmesartan, esomeprazole, potassium chloride SA, and naproxen.  Meds ordered this encounter  Medications  . lisinopril-hydrochlorothiazide (ZESTORETIC) 20-12.5 MG tablet    Sig: Take 1 tablet by mouth daily.    Dispense:  90 tablet    Refill:  1    Appropriate red flag conditions were discussed with the patient as well as actions that should be taken.  Patient expressed his understanding.  Follow-up:  Return in about 1 month (around 03/10/2015).  Kathy Dane, MD

## 2015-02-09 LAB — COMPREHENSIVE METABOLIC PANEL
ALT: 14 U/L (ref 6–29)
AST: 15 U/L (ref 10–35)
Albumin: 4 g/dL (ref 3.6–5.1)
Alkaline Phosphatase: 60 U/L (ref 33–115)
BUN: 9 mg/dL (ref 7–25)
CALCIUM: 9.5 mg/dL (ref 8.6–10.2)
CO2: 26 mmol/L (ref 20–31)
Chloride: 102 mmol/L (ref 98–110)
Creat: 0.73 mg/dL (ref 0.50–1.10)
GLUCOSE: 84 mg/dL (ref 65–99)
POTASSIUM: 3.8 mmol/L (ref 3.5–5.3)
Sodium: 137 mmol/L (ref 135–146)
Total Bilirubin: 0.3 mg/dL (ref 0.2–1.2)
Total Protein: 7.2 g/dL (ref 6.1–8.1)

## 2015-02-09 LAB — LIPID PANEL
CHOL/HDL RATIO: 4 ratio (ref <=5.0)
Cholesterol: 224 mg/dL — ABNORMAL HIGH (ref 125–200)
HDL: 56 mg/dL (ref >=46)
LDL Cholesterol: 134 mg/dL — ABNORMAL HIGH (ref <130)
Triglycerides: 168 mg/dL — ABNORMAL HIGH (ref <150)
VLDL: 34 mg/dL — AB (ref <30)

## 2015-02-09 LAB — CBC
HEMATOCRIT: 39.4 % (ref 36.0–46.0)
Hemoglobin: 13.3 g/dL (ref 12.0–15.0)
MCH: 25.8 pg — AB (ref 26.0–34.0)
MCHC: 33.8 g/dL (ref 30.0–36.0)
MCV: 76.5 fL — AB (ref 78.0–100.0)
MPV: 10.2 fL (ref 8.6–12.4)
Platelets: 370 10*3/uL (ref 150–400)
RBC: 5.15 MIL/uL — ABNORMAL HIGH (ref 3.87–5.11)
RDW: 15.4 % (ref 11.5–15.5)
WBC: 7.2 10*3/uL (ref 4.0–10.5)

## 2015-02-09 LAB — TSH: TSH: 1.178 u[IU]/mL (ref 0.350–4.500)

## 2015-02-10 ENCOUNTER — Other Ambulatory Visit: Payer: Self-pay

## 2015-02-10 DIAGNOSIS — Z1231 Encounter for screening mammogram for malignant neoplasm of breast: Secondary | ICD-10-CM

## 2015-03-10 ENCOUNTER — Ambulatory Visit
Admission: RE | Admit: 2015-03-10 | Discharge: 2015-03-10 | Disposition: A | Discharge status: Home / Self Care | Payer: BLUE CROSS/BLUE SHIELD | Source: Ambulatory Visit | Attending: Emergency Medicine | Admitting: Emergency Medicine

## 2015-03-10 DIAGNOSIS — Z1231 Encounter for screening mammogram for malignant neoplasm of breast: Secondary | ICD-10-CM

## 2015-06-10 ENCOUNTER — Ambulatory Visit (INDEPENDENT_AMBULATORY_CARE_PROVIDER_SITE_OTHER): Payer: BLUE CROSS/BLUE SHIELD | Admitting: Physician Assistant

## 2015-06-10 VITALS — BP 130/88 | HR 90 | Temp 98.5°F | Resp 16 | Ht 64.5 in | Wt 230.0 lb

## 2015-06-10 DIAGNOSIS — R05 Cough: Secondary | ICD-10-CM

## 2015-06-10 DIAGNOSIS — R059 Cough, unspecified: Secondary | ICD-10-CM

## 2015-06-10 DIAGNOSIS — R002 Palpitations: Secondary | ICD-10-CM | POA: Diagnosis not present

## 2015-06-10 LAB — POCT CBC
Granulocyte percent: 68 % (ref 37–80)
HCT, POC: 38.3 % (ref 37.7–47.9)
Hemoglobin: 13.1 g/dL (ref 12.2–16.2)
Lymph, poc: 1.6 (ref 0.6–3.4)
MCH, POC: 27.4 pg (ref 27–31.2)
MCHC: 34.2 g/dL (ref 31.8–35.4)
MCV: 79.9 fL — AB (ref 80–97)
MID (cbc): 0.6 (ref 0–0.9)
MPV: 7.7 fL (ref 0–99.8)
POC Granulocyte: 4.6 (ref 2–6.9)
POC LYMPH PERCENT: 23.4 % (ref 10–50)
POC MID %: 8.6 % (ref 0–12)
Platelet Count, POC: 346 10*3/uL (ref 142–424)
RBC: 4.79 M/uL (ref 4.04–5.48)
RDW, POC: 15.4 %
WBC: 6.7 10*3/uL (ref 4.6–10.2)

## 2015-06-10 LAB — COMPREHENSIVE METABOLIC PANEL
ALT: 14 U/L (ref 6–29)
AST: 17 U/L (ref 10–35)
Albumin: 4.1 g/dL (ref 3.6–5.1)
Alkaline Phosphatase: 55 U/L (ref 33–115)
BILIRUBIN TOTAL: 0.3 mg/dL (ref 0.2–1.2)
BUN: 10 mg/dL (ref 7–25)
CO2: 25 mmol/L (ref 20–31)
CREATININE: 0.75 mg/dL (ref 0.50–1.10)
Calcium: 9.9 mg/dL (ref 8.6–10.2)
Chloride: 100 mmol/L (ref 98–110)
Glucose, Bld: 81 mg/dL (ref 65–99)
Potassium: 3.6 mmol/L (ref 3.5–5.3)
SODIUM: 137 mmol/L (ref 135–146)
Total Protein: 7.8 g/dL (ref 6.1–8.1)

## 2015-06-10 LAB — TSH: TSH: 1.18 mIU/L

## 2015-06-10 MED ORDER — BENZONATATE 100 MG PO CAPS: 100.0000 mg | ORAL_CAPSULE | Freq: Three times a day (TID) | ORAL | Status: DC | PRN

## 2015-06-10 NOTE — Patient Instructions (Addendum)
Because you received labwork today, you will receive an invoice from [employer]. Please contact [facility] at [phone] with questions or concerns regarding your invoice. Our billing staff will not be able to assist you with those questions.  You will be contacted with the lab results as soon as they are available. The fastest way to get your results is to activate your My Chart account. Instructions are located on the last page of this paperwork. If you have not heard from [place] regarding the results in 2 weeks, please contact this office.  I will contact you with your lab results. Stop the decongestant you were taking. You may start taking tessalon three times a day for your cough Drink plenty of water and get plenty of rest. If this happens again, you should see a cardiologist.

## 2015-06-10 NOTE — Progress Notes (Signed)
[facility] [address] [place] [address] [phone]  Date:  06/10/2015   Name:  Kathy Harrison   DOB:  May 03, 1970   MRN:  [id number]  PCP:  Geraldo Pitter, MD    Chief Complaint: Palpitations   History of Present Illness:  This is a 45 y.o. female with PMH HTN, migraines, allergic rhinitis who is presenting with palpitations. States 3-4 hours ago felt like heart was racing and started feeling dizzy. This lasted 1.5 hours. This has resolved now. Now feeling a little lightheaded. Denies sob, blurred vision, cp, weakness, numbness, LE edema. Several years ago she had a similar episode in the setting of elevated BP. Was seen in the ED - was started on BP medication and has not had a problems since. Has never seen a cardiologist. Takes lisinopril 20-hctz 12.5 for HTN. No family history cardiac disease. Pt does not feel stressed. No excess caffeine use. Does not use tobacco, drink alcohol, or use any rec drugs.  Had a little chest congestion x 1 week. Feels like this could be getting a little worse. Dry cough. No other uri symptoms. Denies fever or chills. Been taking a decongestant. Took this AM.  Review of Systems:  Review of Systems See HPI  Patient Active Problem List   Diagnosis Date Noted  . DYSPNEA 05/03/2007    Prior to Admission medications   Medication Sig Start Date End Date Taking? Authorizing Provider  aspirin-acetaminophen-caffeine (EXCEDRIN MIGRAINE) [phone] MG per tablet Take 1 tablet by mouth every 8 (eight) hours as needed for pain.    Yes Historical Provider, MD  lisinopril-hydrochlorothiazide (ZESTORETIC) 20-12.5 MG tablet Take 1 tablet by mouth daily. 02/08/15  Yes Carmelina Dane, MD  loratadine (CLARITIN) 10 MG tablet Take 10 mg by mouth daily as needed for allergies. For allergy symptoms   Yes Historical Provider, MD    Allergies  Allergen Reactions  . Aspirin Nausea And Vomiting    Past Surgical History  Procedure  Laterality Date  . Cesarean section    . Breast reduction surgery      2003  . Wisdom tooth extraction      Social History  Substance Use Topics  . Smoking status: Never Smoker   . Smokeless tobacco: Never Used  . Alcohol Use: No    No family history on file.  Medication list has been reviewed and updated.  Physical Examination:  Physical Exam  Constitutional: She is oriented to person, place, and time. She appears well-developed and well-nourished. No distress.  HENT:  Head: Normocephalic and atraumatic.  Right Ear: Hearing, tympanic membrane, external ear and ear canal normal.  Left Ear: Hearing, tympanic membrane, external ear and ear canal normal.  Nose: Nose normal. Right sinus exhibits no maxillary sinus tenderness and no frontal sinus tenderness. Left sinus exhibits no maxillary sinus tenderness and no frontal sinus tenderness.  Mouth/Throat: Uvula is midline, oropharynx is clear and moist and mucous membranes are normal.  Eyes: Conjunctivae, EOM and lids are normal. Pupils are equal, round, and reactive to light. Right eye exhibits no discharge. Left eye exhibits no discharge. No scleral icterus.  Neck: Carotid bruit is not present. No thyromegaly present.  Cardiovascular: Normal rate, regular rhythm, normal heart sounds and normal pulses.   No murmur heard. Pulmonary/Chest: Effort normal and breath sounds normal. No respiratory distress. She has no wheezes. She has no rhonchi. She has no rales.  Musculoskeletal: Normal range of motion.  Lymphadenopathy:  Head (right side): No submental, no submandibular and no tonsillar adenopathy present.       Head (left side): No submental, no submandibular and no tonsillar adenopathy present.    She has no cervical adenopathy.  Neurological: She is alert and oriented to person, place, and time. She has normal strength and normal reflexes. No cranial nerve deficit or sensory deficit. Gait normal.  Skin: Skin is warm, dry and  intact. No lesion and no rash noted.  Psychiatric: She has a normal mood and affect. Her speech is normal and behavior is normal. Thought content normal.   BP 130/88 mmHg  Pulse 90  Temp(Src) 98.5 F (36.9 C) (Oral)  Resp 16  Ht 5' 4.5" (1.638 m)  Wt 230 lb (104.327 kg)  BMI 38.88 kg/m2  SpO2 98%  LMP 06/10/2015  Results for orders placed or performed in visit on 06/10/15  POCT CBC  Result Value Ref Range   WBC 6.7 4.6 - 10.2 K/uL   Lymph, poc 1.6 0.6 - 3.4   POC LYMPH PERCENT 23.4 10 - 50 %L   MID (cbc) 0.6 0 - 0.9   POC MID % 8.6 0 - 12 %M   POC Granulocyte 4.6 2 - 6.9   Granulocyte percent 68.0 37 - 80 %G   RBC 4.79 4.04 - 5.48 M/uL   Hemoglobin 13.1 12.2 - 16.2 g/dL   HCT, POC 98.1 19.1 - 47.9 %   MCV 79.9 (A) 80 - 97 fL   MCH, POC 27.4 27 - 31.2 pg   MCHC 34.2 31.8 - 35.4 g/dL   RDW, POC 47.8 %   Platelet Count, POC 346 142 - 424 K/uL   MPV 7.7 0 - 99.8 fL   EKG interpreted with Dr. Alwyn Ren: NSR  Assessment and Plan:  1. Palpitations 2. Cough Suspect palpitations d/t decongestant use. Symptoms are resolved now. EKG NSR. CBC, CMP and TSH normal. Offered referral to cardiology, pt declined at this time. She agreed that if it happened again, she would return and would get a referral to cardiology at that time. Pt will take tessalon for cough. Counseled on hydration and rest. Return if symptoms recur.  - EKG 12-Lead - POCT CBC - Comprehensive metabolic panel - TSH - benzonatate (TESSALON) 100 MG capsule; Take 1-2 capsules (100-200 mg total) by mouth 3 (three) times daily as needed for cough.  Dispense: 40 capsule; Refill: 0   [place] Miners. Dyke Brackett, MHS [facility]  06/20/2015

## 2015-06-12 ENCOUNTER — Encounter: Payer: Self-pay | Admitting: *Deleted

## 2015-08-13 ENCOUNTER — Emergency Department (HOSPITAL_COMMUNITY)
Admission: EM | Admit: 2015-08-13 | Discharge: 2015-08-13 | Disposition: A | Discharge status: Home / Self Care | Payer: BLUE CROSS/BLUE SHIELD | Attending: Emergency Medicine | Admitting: Emergency Medicine

## 2015-08-13 ENCOUNTER — Encounter (HOSPITAL_COMMUNITY): Payer: Self-pay | Admitting: Emergency Medicine

## 2015-08-13 DIAGNOSIS — Z79899 Other long term (current) drug therapy: Secondary | ICD-10-CM | POA: Insufficient documentation

## 2015-08-13 DIAGNOSIS — R6884 Jaw pain: Secondary | ICD-10-CM | POA: Diagnosis not present

## 2015-08-13 DIAGNOSIS — I10 Essential (primary) hypertension: Secondary | ICD-10-CM | POA: Diagnosis not present

## 2015-08-13 LAB — BASIC METABOLIC PANEL
ANION GAP: 10 (ref 5–15)
BUN: 11 mg/dL (ref 6–20)
CHLORIDE: 102 mmol/L (ref 101–111)
CO2: 27 mmol/L (ref 22–32)
Calcium: 9.4 mg/dL (ref 8.9–10.3)
Creatinine, Ser: 0.84 mg/dL (ref 0.44–1.00)
GFR calc Af Amer: >60 mL/min (ref >60)
GFR calc non Af Amer: >60 mL/min (ref >60)
Glucose, Bld: 97 mg/dL (ref 65–99)
Potassium: 3.5 mmol/L (ref 3.5–5.1)
Sodium: 139 mmol/L (ref 135–145)

## 2015-08-13 LAB — CBC WITH DIFFERENTIAL/PLATELET
BASOS ABS: 0 10*3/uL (ref 0.0–0.1)
Basophils Relative: 0 %
Eosinophils Absolute: 0.4 10*3/uL (ref 0.0–0.7)
Eosinophils Relative: 7 %
HEMATOCRIT: 39.1 % (ref 36.0–46.0)
Hemoglobin: 12.4 g/dL (ref 12.0–15.0)
LYMPHS ABS: 1.3 10*3/uL (ref 0.7–4.0)
LYMPHS PCT: 24 %
MCH: 25.6 pg — ABNORMAL LOW (ref 26.0–34.0)
MCHC: 31.7 g/dL (ref 30.0–36.0)
MCV: 80.6 fL (ref 78.0–100.0)
Monocytes Absolute: 0.5 10*3/uL (ref 0.1–1.0)
Monocytes Relative: 8 %
NEUTROS ABS: 3.3 10*3/uL (ref 1.7–7.7)
Neutrophils Relative %: 61 %
Platelets: 334 10*3/uL (ref 150–400)
RBC: 4.85 MIL/uL (ref 3.87–5.11)
RDW: 15 % (ref 11.5–15.5)
WBC: 5.4 10*3/uL (ref 4.0–10.5)

## 2015-08-13 LAB — I-STAT TROPONIN, ED: Troponin i, poc: 0 ng/mL (ref 0.00–0.08)

## 2015-08-13 MED ORDER — IBUPROFEN 800 MG PO TABS
800.0000 mg | ORAL_TABLET | Freq: Once | ORAL | Status: AC
  Administered 2015-08-13: 800 mg via ORAL
  Filled 2015-08-13: qty 1

## 2015-08-13 NOTE — ED Notes (Signed)
PA made aware of patient's symptoms. PA at the bedside

## 2015-08-13 NOTE — ED Notes (Signed)
Phlebotomy at the bedside  

## 2015-08-13 NOTE — Discharge Instructions (Signed)
You have been seen today for jaw pain. Your lab tests showed no abnormalities. Follow up with neurology as soon as possible. Use the number provided to call for an appointment. Follow up with PCP as needed. Return to ED should symptoms worsen or you begin to have weakness, facial droop, numbness, changes in vision or ability to think, or any other major concerns. May use ibuprofen or naproxen for pain.

## 2015-08-13 NOTE — ED Notes (Signed)
Per [facility], pt c/o L sided jaw pain, pt also c/o headache. Stroke screen negative. Throbbing "nerve pain" per patient.

## 2015-08-13 NOTE — ED Provider Notes (Signed)
CSN: [id number]     Arrival date & time 08/13/15  1346 History   First MD Initiated Contact with Patient 08/13/15 1351     Chief Complaint  Patient presents with  . Jaw Pain     (Consider location/radiation/quality/duration/timing/severity/associated sxs/prior Treatment) HPI   Kathy Harrison is a 45 y.o. female, with a history of Hypertension, presenting to the ED with Left-sided jaw pain that began suddenly yesterday afternoon while she was in class. Patient states that the pain "feels like nerve pain." Patient rates it 7 out of 10, throbbing, radiating from the angle of her jaw anteriorly to her chin and up the side of her face. Patient states she has not felt this pain before. Patient took a Motrin PM and fell asleep. Pain was still present upon waking this morning. Patient denies nausea/vomiting, difficulty breathing or swallowing, weakness, numbness, tingling, visual changes, or any other complaints. Patient further denies falls or trauma.    Past Medical History  Diagnosis Date  . PONV (postoperative nausea and vomiting)   . Hypertension    Past Surgical History  Procedure Laterality Date  . Cesarean section    . Breast reduction surgery      2003  . Wisdom tooth extraction     No family history on file. Social History  Substance Use Topics  . Smoking status: Never Smoker   . Smokeless tobacco: Never Used  . Alcohol Use: No   OB History    Gravida Para Term Preterm AB TAB SAB Ectopic Multiple Living   Review of Systems  Constitutional: Negative for fever and chills.  HENT: Negative for ear pain, rhinorrhea, sinus pressure, sore throat, trouble swallowing and voice change.        Left-sided jaw pain  Respiratory: Negative for shortness of breath.   Cardiovascular: Negative for chest pain.  Gastrointestinal: Negative for nausea and vomiting.  Musculoskeletal: Negative for back pain, arthralgias and neck pain.  Neurological: Negative for dizziness,  syncope, weakness, light-headedness and numbness.  All other systems reviewed and are negative.     Allergies  Aspirin  Home Medications   Prior to Admission medications   Medication Sig Start Date End Date Taking? Authorizing Provider  aspirin-acetaminophen-caffeine (EXCEDRIN MIGRAINE) [phone] MG per tablet Take 1 tablet by mouth every 8 (eight) hours as needed for pain.     Historical Provider, MD  benzonatate (TESSALON) 100 MG capsule Take 1-2 capsules (100-200 mg total) by mouth 3 (three) times daily as needed for cough. 06/10/15   Dorna Leitz, PA-C  lisinopril-hydrochlorothiazide (ZESTORETIC) 20-12.5 MG tablet Take 1 tablet by mouth daily. 02/08/15   Carmelina Dane, MD  loratadine (CLARITIN) 10 MG tablet Take 10 mg by mouth daily as needed for allergies. For allergy symptoms    Historical Provider, MD   BP 101/67 mmHg  Pulse 87  Temp(Src) 98.2 F (36.8 C) (Oral)  Resp 16  SpO2 100% Physical Exam  Constitutional: She is oriented to person, place, and time. She appears well-developed and well-nourished. No distress.  HENT:  Head: Normocephalic and atraumatic.  Mouth/Throat: Oropharynx is clear and moist.  Patient is able to move all facial muscles without difficulty. No signs of trauma, including no tenderness, erythema, swelling, bruising, or any other abnormalities.  Eyes: Conjunctivae and EOM are normal. Pupils are equal, round, and reactive to light.  Neck: Normal range of motion. Neck supple.  Cardiovascular: Normal rate,  regular rhythm, normal heart sounds and intact distal pulses.   Pulmonary/Chest: Effort normal and breath sounds normal. No respiratory distress.  Abdominal: Soft. There is no tenderness. There is no guarding.  Musculoskeletal: She exhibits no edema or tenderness.  Full ROM in all extremities and spine. No paraspinal tenderness.   Lymphadenopathy:    She has no cervical adenopathy.  Neurological: She is alert and oriented to person, place, and  time. She has normal reflexes.  No sensory deficits. Strength 5/5 in all extremities. No gait disturbance. Coordination intact. Cranial nerves III-XII grossly intact. No facial droop.   Skin: Skin is warm and dry. She is not diaphoretic.  Psychiatric: She has a normal mood and affect. Her behavior is normal.  Nursing note and vitals reviewed.   ED Course  Procedures (including critical care time) Labs Review Labs Reviewed  CBC WITH DIFFERENTIAL/PLATELET - Abnormal; Notable for the following:    MCH 25.6 (*)    All other components within normal limits  BASIC METABOLIC PANEL  I-STAT TROPOININ, ED    I have personally reviewed and evaluated these lab results as part of my medical decision-making.   EKG Interpretation None      MDM   Final diagnoses:  Jaw pain    Wells D Mccubbins presents with left jaw and face pain that began yesterday.  Findings and plan of care discussed with [name], MD.   Low suspicion for ACS, however, since the patient is female, atypical presentation is a possibility. HEART score is 2, indicating low risk for a cardiac event. The pain was not reproducible with palpation. No tenderness over the temple that would give suspicion for temporal arteritis. Upon reassessment, patient states that she is more worried because now the left side of her face was twitching. This was not objectively observed. No indication for imaging at this time. Pain subsided following ibuprofen administration. Patient to follow up with neurology outpatient. Return precautions discussed. Patient voiced understanding of these instructions, accepts the plan, and is comfortable with discharge.    Filed Vitals:   08/13/15 1346 08/13/15 1408 08/13/15 1430  BP:  101/67   Pulse:  87   Temp:  98.2 F (36.8 C) 98.2 F (36.8 C)  TempSrc:  Oral   Resp:  16   SpO2: 98% 100%       Anselm PancoastShawn C Joy, PA-C 08/13/15 1537  [name], MD 08/14/15 1208

## 2015-08-19 ENCOUNTER — Other Ambulatory Visit: Payer: Self-pay

## 2015-08-19 ENCOUNTER — Telehealth: Payer: Self-pay

## 2015-08-19 NOTE — Telephone Encounter (Signed)
Pharmacy called for refil of Lisinopril.  Pt given med 01/2015 and was to return in 1 month for follow up.   Seen in March for other illness.  Advised pharmacy pt needs follow up.  Pharmacy states they have sent 2 requests for refills.

## 2015-08-20 ENCOUNTER — Other Ambulatory Visit: Payer: Self-pay

## 2015-08-20 MED ORDER — LISINOPRIL-HYDROCHLOROTHIAZIDE 20-12.5 MG PO TABS: 1.0000 | ORAL_TABLET | Freq: Every day | ORAL | Status: DC

## 2015-08-21 ENCOUNTER — Ambulatory Visit (INDEPENDENT_AMBULATORY_CARE_PROVIDER_SITE_OTHER): Payer: BLUE CROSS/BLUE SHIELD | Admitting: Internal Medicine

## 2015-08-21 VITALS — BP 134/94 | HR 90 | Temp 98.1°F | Resp 16 | Ht 64.5 in | Wt 232.4 lb

## 2015-08-21 DIAGNOSIS — I10 Essential (primary) hypertension: Secondary | ICD-10-CM

## 2015-08-21 DIAGNOSIS — Z6839 Body mass index (BMI) 39.0-39.9, adult: Secondary | ICD-10-CM | POA: Insufficient documentation

## 2015-08-21 MED ORDER — LISINOPRIL-HYDROCHLOROTHIAZIDE 20-12.5 MG PO TABS
1.0000 | ORAL_TABLET | Freq: Every day | ORAL | Status: DC
Start: 2015-08-21 — End: 2019-12-23

## 2015-08-21 NOTE — Patient Instructions (Signed)
     IF you received an x-ray today, you will receive an invoice from [facility]. Please contact [facility] at [phone] with questions or concerns regarding your invoice.   IF you received labwork today, you will receive an invoice from [facility]. Please contact [facility] at [phone] with questions or concerns regarding your invoice.   Our billing staff will not be able to assist you with questions regarding bills from these companies.  You will be contacted with the lab results as soon as they are available. The fastest way to get your results is to activate your My Chart account. Instructions are located on the last page of this paperwork. If you have not heard from us regarding the results in 2 weeks, please contact this office.      

## 2015-08-21 NOTE — Progress Notes (Signed)
By signing my name below I, Shelah LewandowskyJoseph Thomas, attest that this documentation has been prepared under the direction and in the presence of Ellamae Siaobert Sanna Porcaro, MD. Electonically Signed. Shelah LewandowskyJoseph Thomas, Scribe 08/21/2015 at 2:10 PM  Subjective:    Patient ID: Kathy Harrison, female    DOB: 1970/10/27, 45 y.o.   MRN: [id number]  Chief Complaint  Patient presents with  . Medication Refill    lisinopril    HPI Kathy Harrison is a 45 y.o. female who [facility] for lisinopril refill and BP reevaluation. Pt denies any side effects from the lisinopril. Pt has not taken her BP medication for the past 6 days. Pt denies any leg swelling, heart racing, palpitations.  Pt was recently evaluated at ED for left jaw pain where cardiac disease was ruled out and her CBC and labs were normal.     Patient Active Problem List   Diagnosis Date Noted  . DYSPNEA 05/03/2007    Current outpatient prescriptions:  .  aspirin-acetaminophen-caffeine (EXCEDRIN MIGRAINE) 250-250-65 MG per tablet, Take 1 tablet by mouth every 8 (eight) hours as needed for pain. , Disp: , Rfl:  .  lisinopril-hydrochlorothiazide (ZESTORETIC) 20-12.5 MG tablet, Take 1 tablet by mouth daily., Disp: 90 tablet, Rfl: 0 .  loratadine (CLARITIN) 10 MG tablet, Take 10 mg by mouth daily as needed for allergies. Reported on 08/21/2015, Disp: , Rfl:   Allergies  Allergen Reactions  . Aspirin Nausea And Vomiting      Review of Systems  Cardiovascular: Negative for chest pain, palpitations and leg swelling.       Objective:   Physical Exam  Constitutional: She is oriented to person, place, and time. She appears well-developed and well-nourished. No distress.  HENT:  Head: Normocephalic and atraumatic.  Eyes: Conjunctivae are normal. Pupils are equal, round, and reactive to light.  Neck: Neck supple.  Cardiovascular: Normal rate, regular rhythm and normal heart sounds.  Exam reveals no gallop and no friction rub.    No murmur heard. Pulmonary/Chest: Effort normal and breath sounds normal. She has no decreased breath sounds. She has no wheezes. She has no rhonchi. She has no rales.  Musculoskeletal: Normal range of motion. She exhibits no edema.  Neurological: She is alert and oriented to person, place, and time.  Skin: Skin is warm and dry.  Psychiatric: She has a normal mood and affect. Her behavior is normal.  Nursing note and vitals reviewed.   Filed Vitals:   08/21/15 1301  BP: 134/94----out of meds x 1 week  Pulse: 90  Temp: 98.1 F (36.7 C)  TempSrc: Oral  Resp: 16  Height: 5' 4.5" (1.638 m)  Weight: 232 lb 6.4 oz (105.416 kg)  SpO2: 100%   BP 134/94 mmHg  Pulse 90  Temp(Src) 98.1 F (36.7 C) (Oral)  Resp 16  Ht 5' 4.5" (1.638 m)  Wt 232 lb 6.4 oz (105.416 kg)  BMI 39.29 kg/m2  SpO2 100%  LMP 08/02/2015  See recent labs /ekg wnl     Assessment & Plan:   Essential hypertension  BMI 39.0-39.9,adult  Meds ordered this encounter  Medications  . lisinopril-hydrochlorothiazide (ZESTORETIC) 20-12.5 MG tablet    Sig: Take 1 tablet by mouth daily.    Dispense:  90 tablet    Refill:  3  at next CPE needs Tdap  I have completed the patient encounter in its entirety as documented by the scribe, with editing by me where necessary. Huntley Demedeiros P.  Laney Pastor, M.D.

## 2015-11-02 DIAGNOSIS — I10 Essential (primary) hypertension: Secondary | ICD-10-CM | POA: Diagnosis not present

## 2015-11-02 DIAGNOSIS — Z6839 Body mass index (BMI) 39.0-39.9, adult: Secondary | ICD-10-CM | POA: Diagnosis not present

## 2015-11-02 DIAGNOSIS — F325 Major depressive disorder, single episode, in full remission: Secondary | ICD-10-CM | POA: Diagnosis not present

## 2015-11-02 DIAGNOSIS — E6609 Other obesity due to excess calories: Secondary | ICD-10-CM | POA: Diagnosis not present

## 2015-11-06 DIAGNOSIS — G933 Postviral fatigue syndrome: Secondary | ICD-10-CM | POA: Diagnosis not present

## 2015-11-06 DIAGNOSIS — I10 Essential (primary) hypertension: Secondary | ICD-10-CM | POA: Diagnosis not present

## 2015-11-06 DIAGNOSIS — E669 Obesity, unspecified: Secondary | ICD-10-CM | POA: Diagnosis not present

## 2016-06-27 ENCOUNTER — Other Ambulatory Visit (HOSPITAL_COMMUNITY)
Admission: RE | Admit: 2016-06-27 | Discharge: 2016-06-27 | Disposition: A | Discharge status: Home / Self Care | Payer: Medicaid Other | Source: Ambulatory Visit | Attending: Family Medicine | Admitting: Family Medicine

## 2016-06-27 ENCOUNTER — Other Ambulatory Visit: Payer: Self-pay | Admitting: Family Medicine

## 2016-06-27 DIAGNOSIS — R87810 Cervical high risk human papillomavirus (HPV) DNA test positive: Secondary | ICD-10-CM | POA: Insufficient documentation

## 2016-06-27 DIAGNOSIS — Z1151 Encounter for screening for human papillomavirus (HPV): Secondary | ICD-10-CM | POA: Diagnosis present

## 2016-06-27 DIAGNOSIS — Z01419 Encounter for gynecological examination (general) (routine) without abnormal findings: Secondary | ICD-10-CM | POA: Insufficient documentation

## 2016-06-30 LAB — CYTOLOGY - PAP
Diagnosis: NEGATIVE
HPV 16/18/45 genotyping: NEGATIVE
HPV: DETECTED — AB

## 2016-09-26 DIAGNOSIS — E7800 Pure hypercholesterolemia, unspecified: Secondary | ICD-10-CM | POA: Diagnosis not present

## 2016-09-26 DIAGNOSIS — N951 Menopausal and female climacteric states: Secondary | ICD-10-CM | POA: Diagnosis not present

## 2016-09-26 DIAGNOSIS — I10 Essential (primary) hypertension: Secondary | ICD-10-CM | POA: Diagnosis not present

## 2017-01-16 ENCOUNTER — Emergency Department (HOSPITAL_COMMUNITY): Payer: BLUE CROSS/BLUE SHIELD

## 2017-01-16 ENCOUNTER — Encounter (HOSPITAL_COMMUNITY): Payer: Self-pay

## 2017-01-16 ENCOUNTER — Other Ambulatory Visit: Payer: Self-pay

## 2017-01-16 ENCOUNTER — Emergency Department (HOSPITAL_COMMUNITY)
Admission: EM | Admit: 2017-01-16 | Discharge: 2017-01-16 | Disposition: A | Discharge status: Home / Self Care | Payer: BLUE CROSS/BLUE SHIELD | Attending: Emergency Medicine | Admitting: Emergency Medicine

## 2017-01-16 DIAGNOSIS — R079 Chest pain, unspecified: Secondary | ICD-10-CM | POA: Diagnosis not present

## 2017-01-16 DIAGNOSIS — I10 Essential (primary) hypertension: Secondary | ICD-10-CM | POA: Insufficient documentation

## 2017-01-16 DIAGNOSIS — Z79899 Other long term (current) drug therapy: Secondary | ICD-10-CM | POA: Diagnosis not present

## 2017-01-16 LAB — I-STAT TROPONIN, ED
Troponin i, poc: 0 ng/mL (ref 0.00–0.08)
Troponin i, poc: 0 ng/mL (ref 0.00–0.08)

## 2017-01-16 LAB — BASIC METABOLIC PANEL
ANION GAP: 11 (ref 5–15)
BUN: 9 mg/dL (ref 6–20)
CO2: 24 mmol/L (ref 22–32)
Calcium: 9.3 mg/dL (ref 8.9–10.3)
Chloride: 103 mmol/L (ref 101–111)
Creatinine, Ser: 0.77 mg/dL (ref 0.44–1.00)
GFR calc non Af Amer: >60 mL/min (ref >60)
Glucose, Bld: 130 mg/dL — ABNORMAL HIGH (ref 65–99)
Potassium: 3.3 mmol/L — ABNORMAL LOW (ref 3.5–5.1)
SODIUM: 138 mmol/L (ref 135–145)

## 2017-01-16 LAB — CBC
HCT: 42 % (ref 36.0–46.0)
HEMOGLOBIN: 14.2 g/dL (ref 12.0–15.0)
MCH: 25.9 pg — AB (ref 26.0–34.0)
MCHC: 33.8 g/dL (ref 30.0–36.0)
MCV: 76.5 fL — ABNORMAL LOW (ref 78.0–100.0)
PLATELETS: 246 10*3/uL (ref 150–400)
RBC: 5.49 MIL/uL — AB (ref 3.87–5.11)
RDW: 15.5 % (ref 11.5–15.5)
WBC: 7 10*3/uL (ref 4.0–10.5)

## 2017-01-16 NOTE — ED Notes (Signed)
Pt ambulatory to restroom with steady gait.

## 2017-01-16 NOTE — ED Notes (Signed)
ED Provider at bedside. 

## 2017-01-16 NOTE — ED Notes (Signed)
Unsuccessful attempt to draw blood x 2. Phlebotomy called, will attempt to collect blood.

## 2017-01-16 NOTE — ED Provider Notes (Signed)
[facility]-EMERGENCY DEPT Provider Note   CSN: [id number] Arrival date & time: 01/16/17  1621     History   Chief Complaint Chief Complaint  Patient presents with  . Chest Pain    HPI Kathy Harrison is a 46 y.o. female.  HPI patient presents with right-sided chest pain. Spell per began last night and has been constant. Not pleuritic. Not worse with exertion. States she has felt a little shortness of breath with it at times. States that she's been at home the last few days because she felt as if she was getting sick. No known sick contacts. No nausea vomiting. Does have some fatigue. No swelling of legs. She does not smoke. Does have a history of hypertension. No family history of early coronary artery disease. pain does not change with eating. States she has been sweaty at times over the last couple months but also the last few days. She has seen her primary care doctor for.  Past Medical History:  Diagnosis Date  . Hypertension   . PONV (postoperative nausea and vomiting)     Patient Active Problem List   Diagnosis Date Noted  . Hypertension 08/21/2015  . BMI 39.0-39.9,adult 08/21/2015  . DYSPNEA 05/03/2007    Past Surgical History:  Procedure Laterality Date  . BREAST REDUCTION SURGERY     2003  . CESAREAN SECTION    . WISDOM TOOTH EXTRACTION      OB History    Gravida Para Term Preterm AB Living   SAB TAB Ectopic Multiple Live Births   1 2             Home Medications    Prior to Admission medications   Medication Sig Start Date End Date Taking? Authorizing Provider  aspirin-acetaminophen-caffeine (EXCEDRIN MIGRAINE) [phone] MG per tablet Take 1 tablet by mouth every 8 (eight) hours as needed for pain.    Yes [provider]  lisinopril-hydrochlorothiazide (ZESTORETIC) 20-12.5 MG tablet Take 1 tablet by mouth daily. 08/21/15  Yes Tonye Pearson, MD  loratadine (CLARITIN) 10 MG tablet Take 10 mg by mouth daily as needed for allergies.  Reported on 08/21/2015   Yes [provider]  rosuvastatin (CRESTOR) 20 MG tablet Take 20 mg by mouth daily.   Yes [provider]    Family History History reviewed. No pertinent family history.  Social History Social History  Substance Use Topics  . Smoking status: Never Smoker  . Smokeless tobacco: Never Used  . Alcohol use No     Allergies   Aspirin   Review of Systems Review of Systems  Constitutional: Negative for appetite change and fever.  HENT: Negative for congestion.   Respiratory: Negative for shortness of breath.   Cardiovascular: Positive for chest pain. Negative for leg swelling.  Gastrointestinal: Negative for abdominal pain.  Genitourinary: Negative for flank pain.  Musculoskeletal: Negative for back pain.  Skin: Negative for pallor.  Neurological: Negative for seizures.  Hematological: Negative for adenopathy.  Psychiatric/Behavioral: Negative for confusion.     Physical Exam Updated Vital Signs BP (!) 136/101   Pulse 89   Temp 98.3 F (36.8 C) (Oral)   Resp 19   Ht  (1.575 m)   Wt 102.1 kg (225 lb)   SpO2 96%   BMI 41.15 kg/m   Physical Exam  Constitutional: She appears well-developed.  HENT:  Head: Atraumatic.  Eyes: Pupils are equal, round, and reactive to light.  Neck: Neck supple.  Cardiovascular: Normal rate.   Pulmonary/Chest: Effort normal.  Abdominal: Soft.  Musculoskeletal: She exhibits no edema.  Neurological: She is alert.  Skin: Skin is warm. Capillary refill takes less than 2 seconds.  Psychiatric: She has a normal mood and affect.     ED Treatments / Results  Labs (all labs ordered are listed, but only abnormal results are displayed) Labs Reviewed  BASIC METABOLIC PANEL - Abnormal; Notable for the following:       Result Value   Potassium 3.3 (*)    Glucose, Bld 130 (*)    All other components within normal limits  CBC - Abnormal; Notable for the following:    RBC 5.49 (*)    MCV 76.5  (*)    MCH 25.9 (*)    All other components within normal limits  I-STAT TROPONIN, ED  I-STAT TROPONIN, ED    EKG  EKG Interpretation  Date/Time:  Tuesday January 16 2017 16:20:56 EDT Ventricular Rate:  100 PR Interval:  164 QRS Duration: 70 QT Interval:  348 QTC Calculation: 448 R Axis:   69 Text Interpretation:  Normal sinus rhythm Possible Left atrial enlargement Cannot rule out Anterior infarct , age undetermined Abnormal ECG No significant change since last tracing Confirmed by Benjiman Core [phone]) on 01/16/2017 6:09:19 PM       Radiology Dg Chest 2 View  Result Date: 01/16/2017 CLINICAL DATA:  RIGHT side chest pain for 2 days, hypertension, nonsmoker EXAM: CHEST  2 VIEW COMPARISON:  12/10/2012 FINDINGS: Normal heart size, mediastinal contours, and pulmonary vascularity. Lungs clear. No pleural effusion or pneumothorax. Bones unremarkable. IMPRESSION: Normal exam. Electronically Signed   By: Ulyses Southward M.D.   On: 01/16/2017 17:37    Procedures Procedures (including critical care time)  Medications Ordered in ED Medications - No data to display   Initial Impression / Assessment and Plan / ED Course  I have reviewed the triage vital signs and the nursing notes.  Pertinent labs & imaging results that were available during my care of the patient were reviewed by me and considered in my medical decision making (see chart for details).     Patient presents with chest pain. Right-sided. Somewhat reproducible. EKG reassuring. Enzymes negative. Not hypoxic. Doubt plan was impaired troponin negative 2. Heart score of 2 or 3. Will discharge home and follow-up with PCP.  Final Clinical Impressions(s) / ED Diagnoses   Final diagnoses:  Nonspecific chest pain    New Prescriptions New Prescriptions   No medications on file     Benjiman Core, MD 01/16/17 2114

## 2017-01-16 NOTE — ED Notes (Signed)
EDP at bedside  

## 2017-01-16 NOTE — ED Triage Notes (Signed)
Pt reports right side chest pain, constant and nagging in nature. She reports some radiation to her left arm. Reports headache, fatigue, and hot flashes as well. Skin warm and dry, no distress noted.

## 2017-01-16 NOTE — ED Notes (Signed)
Pt at the nursing station stating "I'm ready to go. I'm ready to go now." This RN explained to pt that phlebotomy has been called and will be here shortly to obtain blood. Pt appears agitated, Nikki RN to attempt drawing pt's blood.

## 2017-01-18 DIAGNOSIS — I10 Essential (primary) hypertension: Secondary | ICD-10-CM | POA: Diagnosis not present

## 2017-05-23 DIAGNOSIS — Z23 Encounter for immunization: Secondary | ICD-10-CM | POA: Diagnosis not present

## 2017-08-14 ENCOUNTER — Other Ambulatory Visit: Payer: Self-pay | Admitting: Family Medicine

## 2017-08-14 DIAGNOSIS — Z1231 Encounter for screening mammogram for malignant neoplasm of breast: Secondary | ICD-10-CM

## 2017-09-07 ENCOUNTER — Ambulatory Visit
Admission: RE | Admit: 2017-09-07 | Discharge: 2017-09-07 | Disposition: A | Discharge status: Home / Self Care | Payer: PRIVATE HEALTH INSURANCE | Source: Ambulatory Visit | Attending: Family Medicine | Admitting: Family Medicine

## 2017-09-07 DIAGNOSIS — Z1231 Encounter for screening mammogram for malignant neoplasm of breast: Secondary | ICD-10-CM

## 2017-12-07 ENCOUNTER — Ambulatory Visit (HOSPITAL_COMMUNITY)
Admission: EM | Admit: 2017-12-07 | Discharge: 2017-12-07 | Disposition: A | Discharge status: Home / Self Care | Payer: BLUE CROSS/BLUE SHIELD | Attending: Internal Medicine | Admitting: Internal Medicine

## 2017-12-07 ENCOUNTER — Encounter (HOSPITAL_COMMUNITY): Payer: Self-pay

## 2017-12-07 DIAGNOSIS — R030 Elevated blood-pressure reading, without diagnosis of hypertension: Secondary | ICD-10-CM

## 2017-12-07 DIAGNOSIS — H9202 Otalgia, left ear: Secondary | ICD-10-CM

## 2017-12-07 DIAGNOSIS — H6121 Impacted cerumen, right ear: Secondary | ICD-10-CM

## 2017-12-07 DIAGNOSIS — I10 Essential (primary) hypertension: Secondary | ICD-10-CM

## 2017-12-07 MED ORDER — FLUTICASONE PROPIONATE 50 MCG/ACT NA SUSP: 2.0000 | Freq: Every day | NASAL | 0 refills | Status: DC

## 2017-12-07 MED ORDER — CETIRIZINE HCL 10 MG PO TABS: 10.0000 mg | ORAL_TABLET | Freq: Every day | ORAL | 0 refills | Status: DC

## 2017-12-07 NOTE — ED Triage Notes (Signed)
Pt presents with right ear being clogged and pain in left ear.

## 2017-12-07 NOTE — Discharge Instructions (Addendum)
Ear lavage performed Rest and drink plenty of fluids Zyrtec prescribed.  Take daily for symptomatic relief Flonase prescribed.  Take daily for symptomatic relief Follow up with PCP if symptoms persists Return here or go to the ER if you have any new or worsening symptoms   Blood pressure elevated in office.  Please recheck in 24 hours.  If it continues to be greater than 140/90 please follow up with PCP for further evaluation and management.

## 2017-12-07 NOTE — ED Provider Notes (Signed)
[facility]   [id number] 12/07/17 Arrival Time: 1620  CC:EAR PAIN  SUBJECTIVE: History from: patient.  Kathy Harrison is a 47 y.o. female who presents with of clogged right ear and left ear pain that began yesterday.  Denies a precipitating event, such as swimming or wearing ear plugs.  Patient states the left ear pain is intermittent and achy in character.  Denies right ear pain, just sensation of being clogged.  Patient has tried OTC sinus medication with temporary relief.  Denies worsening symptoms.  Denies similar symptoms in the past.   Denies fever, chills, fatigue, sinus pain, rhinorrhea, ear discharge, sore throat, SOB, wheezing, chest pain, nausea, changes in bowel or bladder habits.    ROS: As per HPI.  Past Medical History:  Diagnosis Date  . Hypertension   . PONV (postoperative nausea and vomiting)    Past Surgical History:  Procedure Laterality Date  . BREAST REDUCTION SURGERY     2003  . CESAREAN SECTION    . WISDOM TOOTH EXTRACTION     Allergies  Allergen Reactions  . Aspirin Nausea And Vomiting   No current facility-administered medications on file prior to encounter.    Current Outpatient Medications on File Prior to Encounter  Medication Sig Dispense Refill  . aspirin-acetaminophen-caffeine (EXCEDRIN MIGRAINE) 250-250-65 MG per tablet Take 1 tablet by mouth every 8 (eight) hours as needed for pain.     Marland Kitchen. lisinopril-hydrochlorothiazide (ZESTORETIC) 20-12.5 MG tablet Take 1 tablet by mouth daily. 90 tablet 3  . loratadine (CLARITIN) 10 MG tablet Take 10 mg by mouth daily as needed for allergies. Reported on 08/21/2015    . rosuvastatin (CRESTOR) 20 MG tablet Take 20 mg by mouth daily.     Social History   Socioeconomic History  . Marital status: Single    Spouse name: Not on file  . Number of children: Not on file  . Years of education: Not on file  . Highest education level: Not on file  Occupational History  . Not on file  Social Needs  .  Financial resource strain: Not on file  . Food insecurity:    Worry: Not on file    Inability: Not on file  . Transportation needs:    Medical: Not on file    Non-medical: Not on file  Tobacco Use  . Smoking status: Never Smoker  . Smokeless tobacco: Never Used  Substance and Sexual Activity  . Alcohol use: No  . Drug use: No  . Sexual activity: Not Currently  Lifestyle  . Physical activity:    Days per week: Not on file    Minutes per session: Not on file  . Stress: Not on file  Relationships  . Social connections:    Talks on phone: Not on file    Gets together: Not on file    Attends religious service: Not on file    Active member of club or organization: Not on file    Attends meetings of clubs or organizations: Not on file    Relationship status: Not on file  . Intimate partner violence:    Fear of current or ex partner: Not on file    Emotionally abused: Not on file    Physically abused: Not on file    Forced sexual activity: Not on file  Other Topics Concern  . Not on file  Social History Narrative  . Not on file   History reviewed. No pertinent family history.  OBJECTIVE:  Vitals:  12/07/17 1643 12/07/17 1749  BP: (!) 156/111 (!) 154/107  Pulse: 80   Resp: 20   Temp: 98.2 F (36.8 C)   TempSrc: Oral   SpO2: 99%      General appearance: alert; oriented; nontoxic appearance HEENT:Ears: LT EAC clear, TM pearly gray; RT EAC obstructed by cerumen Eyes: PERRL.  EOM grossly intact.  Nose: no obvious clear rhinorrhea; tonsils nonerythematous, uvula midline Neck: supple without LAD Lungs: CTAB Heart: regular rate and rhythm.  Radial pulses 2+ symmetrical bilaterally Skin: warm and dry Psychological: alert and cooperative; normal mood and affect  PROCEDURE:  Consent granted.  Right ear lavage performed by RN.  TM visualized.  PT tolerated procedure well.    ASSESSMENT & PLAN:  1. Impacted cerumen of right ear   2. Left ear pain   3. Elevated blood  pressure reading     No orders of the defined types were placed in this encounter.  Ear lavage performed Rest and drink plenty of fluids Zyrtec prescribed.  Take daily for symptomatic relief Flonase prescribed.  Take daily for symptomatic relief Follow up with PCP if symptoms persists Return here or go to the ER if you have any new or worsening symptoms   Blood pressure elevated in office.  Please recheck in 24 hours.  If it continues to be greater than 140/90 please follow up with PCP for further evaluation and management.    Reviewed expectations re: course of current medical issues. Questions answered. Outlined signs and symptoms indicating need for more acute intervention. Patient verbalized understanding. After Visit Summary given.         Rennis Harding, PA-C 12/07/17 1758

## 2018-03-06 DIAGNOSIS — I10 Essential (primary) hypertension: Secondary | ICD-10-CM | POA: Diagnosis not present

## 2018-03-06 DIAGNOSIS — Z23 Encounter for immunization: Secondary | ICD-10-CM | POA: Diagnosis not present

## 2018-03-17 ENCOUNTER — Emergency Department (HOSPITAL_COMMUNITY)
Admission: EM | Admit: 2018-03-17 | Discharge: 2018-03-17 | Disposition: A | Discharge status: Home / Self Care | Payer: BLUE CROSS/BLUE SHIELD | Attending: Emergency Medicine | Admitting: Emergency Medicine

## 2018-03-17 ENCOUNTER — Encounter (HOSPITAL_COMMUNITY): Payer: Self-pay | Admitting: Emergency Medicine

## 2018-03-17 DIAGNOSIS — I10 Essential (primary) hypertension: Secondary | ICD-10-CM | POA: Insufficient documentation

## 2018-03-17 DIAGNOSIS — Z79899 Other long term (current) drug therapy: Secondary | ICD-10-CM | POA: Insufficient documentation

## 2018-03-17 DIAGNOSIS — J069 Acute upper respiratory infection, unspecified: Secondary | ICD-10-CM | POA: Diagnosis not present

## 2018-03-17 DIAGNOSIS — Z209 Contact with and (suspected) exposure to unspecified communicable disease: Secondary | ICD-10-CM | POA: Diagnosis not present

## 2018-03-17 DIAGNOSIS — J019 Acute sinusitis, unspecified: Secondary | ICD-10-CM | POA: Insufficient documentation

## 2018-03-17 DIAGNOSIS — R0981 Nasal congestion: Secondary | ICD-10-CM | POA: Diagnosis present

## 2018-03-17 MED ORDER — AMOXICILLIN-POT CLAVULANATE 875-125 MG PO TABS: 1.0000 | ORAL_TABLET | Freq: Two times a day (BID) | ORAL | 0 refills | Status: DC

## 2018-03-17 MED ORDER — FLUTICASONE PROPIONATE 50 MCG/ACT NA SUSP: 2.0000 | Freq: Every day | NASAL | 0 refills | Status: DC

## 2018-03-17 NOTE — Discharge Instructions (Signed)
Continue to stay well-hydrated. Gargle warm salt water and spit it out and use chloraseptic spray as needed for sore throat. Continue to alternate between Tylenol and Ibuprofen for pain or fever. Use REGULAR Mucinex/Robitussin/etc (without decongestants) for cough suppression/expectoration of mucus. Use over the counter flonase and the netipot to help with nasal congestion. Use over the counter Coricidin HBP for congestion. May consider over-the-counter Benadryl or other antihistamine like Claritin/Zyrtec/etc to decrease secretions and for help with your symptoms. If your symptoms worsen, or you develop fevers, or don't improve in the next 1 week, then you can start taking the antibiotic prescribed, however this could be a viral illness so you should hold off on taking an antibiotic until any of those criteria are met. Follow up with your primary care doctor in 5-7 days for recheck of ongoing symptoms. Return to emergency department for emergent changing or worsening of symptoms.

## 2018-03-17 NOTE — ED Triage Notes (Signed)
Pt c/o sinus pain, dental pain, cough and congestion since last week.

## 2018-03-17 NOTE — ED Provider Notes (Signed)
[facility]-EMERGENCY DEPT Provider Note   CSN: [id number] Arrival date & time: 03/17/18  1518     History   Chief Complaint Chief Complaint  Patient presents with  . Nasal Congestion  . Cough  . Dental Pain    HPI Kathy Harrison is a 47 y.o. female with a PMHx of HTN, who presents to the ED with complaints of 3 days of gradually worsening sinus congestion and facial pain.  She describes the pain as 10/10 constant throbbing nonradiating facial pain no known aggravating factors and minimally improved with Tylenol.  She reports associated sinus congestion, dry cough, chills, and sore throat.  She denies any rhinorrhea, ear pain or drainage, drooling, trismus, known fevers, vision changes, CP, SOB, abd pain, N/V/D/C, hematuria, dysuria, myalgias, arthralgias, numbness, tingling, focal weakness, or any other complaints at this time.  She received her flu shot this year.  She states that her family member has also been sick with similar symptoms.  The history is provided by the patient and medical records. No language interpreter was used.  Cough  Associated symptoms include chills and sore throat. Pertinent negatives include no chest pain, no ear pain, no myalgias and no shortness of breath.  Dental Pain      Past Medical History:  Diagnosis Date  . Hypertension   . PONV (postoperative nausea and vomiting)     Patient Active Problem List   Diagnosis Date Noted  . Hypertension 08/21/2015  . BMI 39.0-39.9,adult 08/21/2015  . DYSPNEA 05/03/2007    Past Surgical History:  Procedure Laterality Date  . BREAST REDUCTION SURGERY     2003  . CESAREAN SECTION    . WISDOM TOOTH EXTRACTION       OB History    Gravida  6   Para  3   Term  3   Preterm      AB  3   Living  3     SAB  1   TAB  2   Ectopic      Multiple      Live Births               Home Medications    Prior to Admission medications   Medication Sig Start Date End Date  Taking? Authorizing Provider  aspirin-acetaminophen-caffeine (EXCEDRIN MIGRAINE) 5348815291250-250-65 MG per tablet Take 1 tablet by mouth every 8 (eight) hours as needed for pain.     [provider]  cetirizine (ZYRTEC) 10 MG tablet Take 1 tablet (10 mg total) by mouth daily. 12/07/17   Wurst, Grenada[place]itt[place], PA-C  fluticasone (FLONASE) 50 MCG/ACT nasal spray Place 2 sprays into both nostrils daily. 12/07/17   Wurst, [place]any, PA-C  lisinopril-hydrochlorothiazide (ZESTORETIC) 20-12.5 MG tablet Take 1 tablet by mouth daily. 08/21/15   Tonye Pearsonoolittle, Robert P, MD  rosuvastatin (CRESTOR) 20 MG tablet Take 20 mg by mouth daily.    [provider]    Family History No family history on file.  Social History Social History   Tobacco Use  . Smoking status: Never Smoker  . Smokeless tobacco: Never Used  Substance Use Topics  . Alcohol use: No  . Drug use: No     Allergies   Aspirin   Review of Systems Review of Systems  Constitutional: Positive for chills. Negative for fever.  HENT: Positive for congestion, sinus pressure, sinus pain and sore throat. Negative for drooling, ear pain and trouble swallowing.   Eyes: Negative for visual disturbance.  Respiratory:  Positive for cough. Negative for shortness of breath.   Cardiovascular: Negative for chest pain.  Gastrointestinal: Negative for abdominal pain, constipation, diarrhea, nausea and vomiting.  Genitourinary: Negative for dysuria and hematuria.  Musculoskeletal: Negative for arthralgias and myalgias.  Skin: Negative for color change.  Allergic/Immunologic: Negative for immunocompromised state.  Neurological: Negative for weakness and numbness.  Psychiatric/Behavioral: Negative for confusion.   All other systems reviewed and are negative for acute change except as noted in the HPI.    Physical Exam Updated Vital Signs BP (!) 151/104 (BP Location: Left Arm) Comment: hasnt had today HTN meds  Pulse (!) 109   Temp 98.3 F (36.8 C)  (Oral)   Resp 18   SpO2 100%   Physical Exam  Constitutional: She is oriented to person, place, and time. Vital signs are normal. She appears well-developed and well-nourished.  Non-toxic appearance. No distress.  Afebrile, nontoxic, NAD  HENT:  Head: Normocephalic and atraumatic.  Nose: Mucosal edema present. Right sinus exhibits maxillary sinus tenderness and frontal sinus tenderness. Left sinus exhibits maxillary sinus tenderness and frontal sinus tenderness.  Mouth/Throat: Uvula is midline and mucous membranes are normal. No trismus in the jaw. No uvula swelling. Posterior oropharyngeal erythema present. No oropharyngeal exudate, posterior oropharyngeal edema or tonsillar abscesses. Tonsils are 0 on the right. Tonsils are 0 on the left. No tonsillar exudate.  Nose moderately congested with moderate mucosal edema, diffuse sinus TTP. Oropharynx mildly injected, without uvular swelling or deviation, no trismus or drooling, no tonsillar swelling, no exudates. No PTA   Eyes: Conjunctivae and EOM are normal. Right eye exhibits no discharge. Left eye exhibits no discharge.  Neck: Normal range of motion. Neck supple.  Cardiovascular: Normal rate, regular rhythm, normal heart sounds and intact distal pulses. Exam reveals no gallop and no friction rub.  No murmur heard. Tachycardic in triage which resolved on exam  Pulmonary/Chest: Effort normal and breath sounds normal. No respiratory distress. She has no decreased breath sounds. She has no wheezes. She has no rhonchi. She has no rales.  CTAB in all lung fields, no w/r/r, no hypoxia or increased WOB, speaking in full sentences, SpO2 100% on RA   Abdominal: Soft. Normal appearance and bowel sounds are normal. She exhibits no distension. There is no tenderness. There is no rigidity, no rebound, no guarding, no CVA tenderness, no tenderness at McBurney's point and negative Murphy's sign.  Musculoskeletal: Normal range of motion.  Neurological: She is  alert and oriented to person, place, and time. She has normal strength. No sensory deficit.  Skin: Skin is warm, dry and intact. No rash noted.  Psychiatric: She has a normal mood and affect.  Nursing note and vitals reviewed.    ED Treatments / Results  Labs (all labs ordered are listed, but only abnormal results are displayed) Labs Reviewed - No data to display  EKG None  Radiology No results found.  Procedures Procedures (including critical care time)  Medications Ordered in ED Medications - No data to display   Initial Impression / Assessment and Plan / ED Course  I have reviewed the triage vital signs and the nursing notes.  Pertinent labs & imaging results that were available during my care of the patient were reviewed by me and considered in my medical decision making (see chart for details).     47 y.o. female here with sinus congestion and facial pain to the last 3 days which is worsening.  On exam, moderate nasal congestion, diffuse sinus tenderness,  mildly erythematous throat but otherwise unremarkable oropharynx exam, lungs clear.  Likely viral sinusitis, however given severity of symptoms could consider possibility of bacterial etiology.  Discussed appropriate over-the-counter remedies for symptomatic care, will prescribe Flonase, but advised that if she is started developing fevers, worsening symptoms, or was not improving over the next week then she could start the safety prescription of antibiotic given today.  Strict guidelines regarding this were discussed.  Advised follow-up with PCP in 1 week. Doubt need for labs/imaging at this time. I explained the diagnosis and have given explicit precautions to return to the ER including for any other new or worsening symptoms. The patient understands and accepts the medical plan as it's been dictated and I have answered their questions. Discharge instructions concerning home care and prescriptions have been given. The patient  is STABLE and is discharged to home in good condition.    Final Clinical Impressions(s) / ED Diagnoses   Final diagnoses:  Acute sinusitis, recurrence not specified, unspecified location  Upper respiratory tract infection, unspecified type    ED Discharge Orders         Ordered    fluticasone (FLONASE) 50 MCG/ACT nasal spray  Daily     03/17/18 1716    amoxicillin-clavulanate (AUGMENTIN) 875-125 MG tablet  2 times daily     03/17/18 [address] [place], [place] 03/17/18 1723    Arby Barrette, MD 03/21/18 [phone]

## 2018-03-17 NOTE — ED Notes (Signed)
Bed: [facility] Expected date:  Expected time:  Means of arrival:  Comments: 

## 2018-05-08 ENCOUNTER — Encounter: Payer: Self-pay | Admitting: Certified Nurse Midwife

## 2018-05-08 ENCOUNTER — Ambulatory Visit (INDEPENDENT_AMBULATORY_CARE_PROVIDER_SITE_OTHER): Payer: BLUE CROSS/BLUE SHIELD | Admitting: Certified Nurse Midwife

## 2018-05-08 ENCOUNTER — Other Ambulatory Visit: Payer: Self-pay

## 2018-05-08 ENCOUNTER — Other Ambulatory Visit (HOSPITAL_COMMUNITY)
Admission: RE | Admit: 2018-05-08 | Discharge: 2018-05-08 | Disposition: A | Discharge status: Home / Self Care | Payer: BLUE CROSS/BLUE SHIELD | Source: Ambulatory Visit | Attending: Certified Nurse Midwife | Admitting: Certified Nurse Midwife

## 2018-05-08 VITALS — BP 124/84 | HR 68 | Resp 16 | Ht 62.5 in | Wt 211.0 lb

## 2018-05-08 DIAGNOSIS — N898 Other specified noninflammatory disorders of vagina: Secondary | ICD-10-CM

## 2018-05-08 DIAGNOSIS — N951 Menopausal and female climacteric states: Secondary | ICD-10-CM

## 2018-05-08 DIAGNOSIS — N912 Amenorrhea, unspecified: Secondary | ICD-10-CM | POA: Diagnosis not present

## 2018-05-08 DIAGNOSIS — Z124 Encounter for screening for malignant neoplasm of cervix: Secondary | ICD-10-CM

## 2018-05-08 DIAGNOSIS — Z113 Encounter for screening for infections with a predominantly sexual mode of transmission: Secondary | ICD-10-CM | POA: Diagnosis not present

## 2018-05-08 DIAGNOSIS — Z3202 Encounter for pregnancy test, result negative: Secondary | ICD-10-CM

## 2018-05-08 DIAGNOSIS — Z0000 Encounter for general adult medical examination without abnormal findings: Secondary | ICD-10-CM

## 2018-05-08 DIAGNOSIS — Z01419 Encounter for gynecological examination (general) (routine) without abnormal findings: Secondary | ICD-10-CM | POA: Diagnosis not present

## 2018-05-08 DIAGNOSIS — Z8742 Personal history of other diseases of the female genital tract: Secondary | ICD-10-CM

## 2018-05-08 LAB — POCT URINE PREGNANCY: Preg Test, Ur: NEGATIVE

## 2018-05-08 NOTE — Progress Notes (Signed)
48 y.o. [id number] Single  African American Fe here to establish gyn care and for annual exam. LMP 12/09/2017, UPT here today negative. She has been having hot flashes/night sweats with no insomnia issues, so feels this menopause. No vaginal bleeding or vaginal dryness. Not sexually active since 2015. [facility], [facility]. Working on weight loss with diet change and 19 pounds weight loss so far. Continues to work on with a normal weight as her goal. Would like STD screening to make sure no concerns from previous relationship. Denies any vaginal discharge or odor.  No other health issues today.  Patient's last menstrual period was 12/09/2017 (exact date).          Sexually active: No.  The current method of family planning is abstinence.    Exercising: No.  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 She has never used smokeless tobacco. She reports that she does not drink alcohol or use drugs.  Past Medical History:  Diagnosis Date  . Blood transfusion without reported diagnosis   . Hypertension   . PONV (postoperative nausea and vomiting)     Past Surgical History:  Procedure Laterality Date  . BREAST REDUCTION SURGERY     2003  . CESAREAN SECTION    . WISDOM TOOTH EXTRACTION      Current Outpatient Medications  Medication Sig Dispense Refill  . cetirizine (ZYRTEC) 10 MG tablet Take 1 tablet (10 mg total) by mouth daily. 30 tablet 0  . fluticasone (FLONASE) 50 MCG/ACT nasal spray Place 2 sprays into both nostrils daily. 16 g 0  . lisinopril-hydrochlorothiazide (ZESTORETIC) 20-12.5 MG tablet Take 1 tablet by mouth daily. 90 tablet 3   No current  facility-administered medications for this visit.     Family History  Problem Relation Age of Onset  . Hypertension Mother   . Stroke Mother     ROS:  Pertinent items are noted in HPI.  Otherwise, a comprehensive ROS was negative.  Exam:   BP 124/84   Pulse 68   Resp 16   Ht 5' 2.5" (1.588 m)   Wt 211 lb (95.7 kg)   LMP 12/09/2017 (Exact Date)   BMI 37.98 kg/m  Height: 5' 2.5" (158.8 cm) Ht Readings from Last 3 Encounters:  05/08/18 5' 2.5" (1.588 m)  01/16/17 5\' 2"  (1.575 m)  08/21/15 5' 4.5" (1.638 m)    General appearance: alert, cooperative and appears stated age Head: Normocephalic, without obvious abnormality, atraumatic Neck: no adenopathy, supple, symmetrical, trachea midline and thyroid normal to inspection and palpation Lungs: clear to auscultation bilaterally Breasts: normal appearance, no masses or tenderness, No nipple retraction or dimpling, No nipple discharge or bleeding, No axillary or supraclavicular adenopathy, reduction scarring noted bilateral Heart: regular rate and rhythm Abdomen: soft, non-tender; no masses,  no organomegaly Extremities: extremities normal, atraumatic, no cyanosis or edema Skin: Skin color, texture, turgor normal. No rashes or lesions Lymph nodes: Cervical, supraclavicular, and axillary nodes normal. No abnormal inguinal nodes palpated Neurologic: Grossly normal   Pelvic: External genitalia:  no lesions, or exudate  Urethra:  normal appearing urethra with no masses, tenderness or lesions              Bartholin's and Skene's: normal                 Vagina: normal appearing vagina with normal color and discharge, no lesions              Cervix: no cervical motion tenderness, no lesions and normal appearance              Pap taken: Yes.   Bimanual Exam:  Uterus:  normal size, contour, position, consistency, mobility, non-tender and anteverted              Adnexa: normal adnexa and no mass, fullness, tenderness                Rectovaginal: Confirms               Anus:  normal sphincter tone, no lesions  Chaperone present: yes  A:  Well Woman with normal exam  Perimenopausal with amenorrhea negative UPT  PCP management of cholesterol  History of abnormal pap with + HPV, negative 16,18,45, repeat due today  STD screening  P:   Reviewed health and wellness pertinent to exam  Discussed amenorrhea can occur with weight change, thyroid changes and pituitary as well as menopause. Recommend lab screening to assess. Patient agreeable.  Lab: FSH,TSH,Prolactin  Continue follow up with PCP as indicated  Will repeat pap today  Lab: STD panel, Gc/chlamydia, Affirm, Hep C  Pap smear: yes   counseled on breast self exam, mammography screening, STD prevention, HIV risk factors and prevention, feminine hygiene, menopause, adequate intake of calcium and vitamin D, diet and exercise, given menopausal printed material and needs to advise if vaginal bleeding occurs. Questions addressed.   return annually or prn  An After Visit Summary was printed and given to the patient.

## 2018-05-08 NOTE — Patient Instructions (Signed)

## 2018-05-09 LAB — FOLLICLE STIMULATING HORMONE: FSH: 38.8 m[IU]/mL

## 2018-05-09 LAB — TSH: TSH: 1.06 u[IU]/mL (ref 0.450–4.500)

## 2018-05-09 LAB — HEP, RPR, HIV PANEL
HIV SCREEN 4TH GENERATION: NONREACTIVE
Hepatitis B Surface Ag: NEGATIVE
RPR: NONREACTIVE

## 2018-05-09 LAB — HEPATITIS C ANTIBODY: Hep C Virus Ab: <0.1 s/co ratio (ref 0.0–0.9)

## 2018-05-09 LAB — VAGINITIS/VAGINOSIS, DNA PROBE
CANDIDA SPECIES: NEGATIVE
Gardnerella vaginalis: NEGATIVE
Trichomonas vaginosis: NEGATIVE

## 2018-05-09 LAB — PROLACTIN: Prolactin: 10.7 ng/mL (ref 4.8–23.3)

## 2018-05-10 ENCOUNTER — Telehealth: Payer: Self-pay

## 2018-05-10 ENCOUNTER — Other Ambulatory Visit: Payer: Self-pay | Admitting: Certified Nurse Midwife

## 2018-05-10 DIAGNOSIS — N912 Amenorrhea, unspecified: Secondary | ICD-10-CM

## 2018-05-10 NOTE — Telephone Encounter (Signed)
Patient notified of results. Patient has not been sexually active & agrees to abstain from intercourse until after she is done with the provera challenge. Lab appt is scheduled.

## 2018-05-10 NOTE — Telephone Encounter (Signed)
-----   Message from Verner Chol, CNM sent at 05/10/2018  7:53 AM EST ----- Notify patient her vaginal screen is negative for yeast, BV and trichomonas Hep B,C HIV and RPR are negative TSH and prolactin are normal FSH is showing perimenopausal range Pap and other STD pending.  Has she been sexually active since visit, if no, needs to continue no sexual activity, and repeat Serum HCG in 2 weeks so can give Provera to make sure no concerns with endometrial changes or hyperplasia with perimenopause. Please advise so order can be placed

## 2018-05-11 ENCOUNTER — Other Ambulatory Visit: Payer: Self-pay | Admitting: Certified Nurse Midwife

## 2018-05-11 DIAGNOSIS — N912 Amenorrhea, unspecified: Secondary | ICD-10-CM

## 2018-05-14 LAB — CYTOLOGY - PAP
Adequacy: ABSENT
CHLAMYDIA, DNA PROBE: NEGATIVE
Diagnosis: NEGATIVE
HPV: NOT DETECTED
Neisseria Gonorrhea: NEGATIVE

## 2018-05-23 ENCOUNTER — Other Ambulatory Visit: Payer: Self-pay | Admitting: Certified Nurse Midwife

## 2018-05-23 ENCOUNTER — Telehealth: Payer: Self-pay

## 2018-05-23 ENCOUNTER — Other Ambulatory Visit (INDEPENDENT_AMBULATORY_CARE_PROVIDER_SITE_OTHER): Payer: BLUE CROSS/BLUE SHIELD

## 2018-05-23 DIAGNOSIS — N912 Amenorrhea, unspecified: Secondary | ICD-10-CM

## 2018-05-23 LAB — HCG, SERUM, QUALITATIVE: HCG, BETA SUBUNIT, QUAL, SERUM: NEGATIVE m[IU]/mL (ref <6)

## 2018-05-23 MED ORDER — MEDROXYPROGESTERONE ACETATE 10 MG PO TABS: 10.0000 mg | ORAL_TABLET | Freq: Every day | ORAL | 0 refills | Status: DC

## 2018-05-23 NOTE — Telephone Encounter (Signed)
-----   Message from Verner Chol, CNM sent at 05/23/2018 12:15 PM EST ----- Notify patient her HCG is negative. If no sexually activity will need Rx Provera 10 mg x 10 days. She may or may not have bleeding up to two weeks after completion. Needs to call and advise if bleeding or none.

## 2018-05-23 NOTE — Telephone Encounter (Signed)
Patient notified of results. Per patient no sexual activity. Provera sent to pharmacy. Patient to call with provera response.

## 2018-05-23 NOTE — Telephone Encounter (Signed)
Left message for call back.

## 2018-05-23 NOTE — Telephone Encounter (Signed)
Patient returned call to Joy. °

## 2018-06-05 DIAGNOSIS — Z760 Encounter for issue of repeat prescription: Secondary | ICD-10-CM | POA: Diagnosis not present

## 2018-06-05 DIAGNOSIS — I10 Essential (primary) hypertension: Secondary | ICD-10-CM | POA: Diagnosis not present

## 2018-08-26 DIAGNOSIS — Z760 Encounter for issue of repeat prescription: Secondary | ICD-10-CM | POA: Diagnosis not present

## 2018-08-26 DIAGNOSIS — I10 Essential (primary) hypertension: Secondary | ICD-10-CM | POA: Diagnosis not present

## 2018-09-16 ENCOUNTER — Other Ambulatory Visit: Payer: Self-pay | Admitting: Family Medicine

## 2018-09-16 DIAGNOSIS — E7800 Pure hypercholesterolemia, unspecified: Secondary | ICD-10-CM | POA: Diagnosis not present

## 2018-09-16 DIAGNOSIS — Z1231 Encounter for screening mammogram for malignant neoplasm of breast: Secondary | ICD-10-CM

## 2018-09-16 DIAGNOSIS — I10 Essential (primary) hypertension: Secondary | ICD-10-CM | POA: Diagnosis not present

## 2018-09-21 ENCOUNTER — Other Ambulatory Visit: Payer: Self-pay

## 2018-09-21 ENCOUNTER — Ambulatory Visit
Admission: RE | Admit: 2018-09-21 | Discharge: 2018-09-21 | Disposition: A | Discharge status: Home / Self Care | Payer: BLUE CROSS/BLUE SHIELD | Source: Ambulatory Visit | Attending: Family Medicine | Admitting: Family Medicine

## 2018-09-21 DIAGNOSIS — Z1231 Encounter for screening mammogram for malignant neoplasm of breast: Secondary | ICD-10-CM | POA: Diagnosis not present

## 2018-09-30 DIAGNOSIS — E7800 Pure hypercholesterolemia, unspecified: Secondary | ICD-10-CM | POA: Diagnosis not present

## 2018-09-30 DIAGNOSIS — I10 Essential (primary) hypertension: Secondary | ICD-10-CM | POA: Diagnosis not present

## 2018-10-30 ENCOUNTER — Encounter: Payer: Self-pay | Admitting: Obstetrics & Gynecology

## 2018-10-30 DIAGNOSIS — K5730 Diverticulosis of large intestine without perforation or abscess without bleeding: Secondary | ICD-10-CM | POA: Diagnosis not present

## 2018-10-30 DIAGNOSIS — K648 Other hemorrhoids: Secondary | ICD-10-CM | POA: Diagnosis not present

## 2018-10-30 DIAGNOSIS — Z1211 Encounter for screening for malignant neoplasm of colon: Secondary | ICD-10-CM | POA: Diagnosis not present

## 2018-12-25 ENCOUNTER — Telehealth: Payer: Self-pay | Admitting: Certified Nurse Midwife

## 2018-12-25 NOTE — Telephone Encounter (Signed)
Call to patient. Patient states she is having some right breast tenderness that started about 2 weeks ago. Denies feeling a lump, but states she does notice the tenderness more when "doing things." Denies rash, nipple discharge or fever. States she is not currently having periods. OV recommended. Patient declines appointment offered today due to work schedule. Patient advised Debbi out of office tomorrow. Requesting to schedule with Debbi. Patient scheduled for 12-27-2018 at 1500. Patient agreeable to date and time of appointment. Patient aware to return call to office prior to appointment if rash, fever or nipple discharge develops.   Routing to provider and will close encounter.

## 2018-12-25 NOTE — Telephone Encounter (Signed)
Patient is calling to report that is having breast pain.

## 2018-12-26 ENCOUNTER — Other Ambulatory Visit: Payer: Self-pay

## 2018-12-27 ENCOUNTER — Encounter: Payer: Self-pay | Admitting: Obstetrics & Gynecology

## 2018-12-27 ENCOUNTER — Ambulatory Visit: Payer: BLUE CROSS/BLUE SHIELD | Admitting: Certified Nurse Midwife

## 2018-12-27 ENCOUNTER — Ambulatory Visit: Payer: BC Managed Care – PPO | Admitting: Obstetrics & Gynecology

## 2018-12-27 VITALS — BP 128/96 | HR 88 | Temp 97.2°F | Ht 62.5 in | Wt 223.0 lb

## 2018-12-27 DIAGNOSIS — N644 Mastodynia: Secondary | ICD-10-CM | POA: Diagnosis not present

## 2018-12-27 NOTE — Progress Notes (Signed)
GYNECOLOGY  VISIT  CC:   Right breast pain x 2 weeks  HPI: 48 y.o. [id number] Single Black or African American female here for right breast pain that has been present for about two weeks.  Denies any recent trauma.  She cannot feel a lump.  Denies nipple discharge.  Reports she doesn't drink much caffeine but when specifically asked, she does drink a lot of iced tea.  She didn't really think about this causing breast pain.  Feels her bra fits well.  Last MMG was 09/21/2018 and was a 3D MMG.  Has no family hx of breast cancer.  She did have a breast reduction in 2003.  GYNECOLOGIC HISTORY: No LMP recorded. (Menstrual status: Perimenopausal). Contraception: PMP Menopausal hormone therapy: none  Patient Active Problem List   Diagnosis Date Noted  . Hypertension 08/21/2015  . BMI 39.0-39.9,adult 08/21/2015  . DYSPNEA 05/03/2007    Past Medical History:  Diagnosis Date  . Abnormal Pap smear of cervix    neg HPV HR 16,18/45  . Blood transfusion without reported diagnosis   . Hypertension   . PONV (postoperative nausea and vomiting)     Past Surgical History:  Procedure Laterality Date  . BREAST REDUCTION SURGERY     2003  . CESAREAN SECTION    . REDUCTION MAMMAPLASTY Bilateral 2003  . WISDOM TOOTH EXTRACTION      MEDS:   Current Outpatient Medications on File Prior to Visit  Medication Sig Dispense Refill  . fluticasone (FLONASE) 50 MCG/ACT nasal spray Place 2 sprays into both nostrils daily. 16 g 0  . lisinopril-hydrochlorothiazide (ZESTORETIC) 20-12.5 MG tablet Take 1 tablet by mouth daily. 90 tablet 3  . medroxyPROGESTERone (PROVERA) 10 MG tablet Take 1 tablet (10 mg total) by mouth daily. 10 tablet 0   No current facility-administered medications on file prior to visit.     ALLERGIES: Aspirin  Family History  Problem Relation Age of Onset  . Hypertension Mother   . Stroke Mother     SH:  Single, non smoker  Review of Systems  Genitourinary:       Right breast  pain   All other systems reviewed and are negative.   PHYSICAL EXAMINATION:    BP (!) 128/96   Pulse 88   Temp (!) 97.2 F (36.2 C) (Temporal)   Ht 5' 2.5" (1.588 m)   Wt 223 lb (101.2 kg)   BMI 40.14 kg/m      Physical Exam  Constitutional: She is oriented to person, place, and time. She appears well-developed and well-nourished.  Neck: Normal range of motion. Neck supple. No thyromegaly present.  Respiratory: Right breast exhibits tenderness. Right breast exhibits no inverted nipple, no mass, no nipple discharge and no skin change. Left breast exhibits no inverted nipple, no mass, no nipple discharge, no skin change and no tenderness. Breasts are symmetrical.    Lymphadenopathy:    She has no cervical adenopathy.    She has axillary adenopathy.       Right: No supraclavicular adenopathy present.       Left: No supraclavicular adenopathy present.  Neurological: She is alert and oriented to person, place, and time.  Skin: Skin is warm and dry.  Psychiatric: She has a normal mood and affect.    Chaperone was present for exam.  Assessment: Breast pain on the right without discrete mass/finding on exam  Plan: Pt is going to decrease caffeine and make sure bras do fit well.  Plan recheck with pt  in 2 weeks.  If pain is still present, consider imaging.   Anti-inflammatories and Vit E recommended as well.

## 2018-12-30 NOTE — Progress Notes (Signed)
Patient to be called in 2weeks

## 2019-01-10 ENCOUNTER — Telehealth: Payer: Self-pay

## 2019-01-10 NOTE — Telephone Encounter (Signed)
Spoke to patient & she said she cut back on her caffeine it that has helped a lot. Patient states she is doing fine with no problems. Patient it aware to callback with any problems or concerns. Routing to Dr Sabra Heck for review.

## 2019-01-10 NOTE — Telephone Encounter (Signed)
-----   Message from Megan Salon, MD sent at 12/27/2018  6:22 PM EDT ----- Debbi,This is a pt of yours I saw on Friday.  I advised we would recheck with her in two weeks to see if any changes she's made helped.  Joy--can you check with her in two weeks please?  Thanks.Vinnie Level

## 2019-01-10 NOTE — Progress Notes (Signed)
Spoke to patient & she said she cut back on her caffeine it that has helped a lot. Patient states she is doing fine with no problems. Patient it aware to callback with any problems or concerns.

## 2019-05-13 DIAGNOSIS — Z111 Encounter for screening for respiratory tuberculosis: Secondary | ICD-10-CM | POA: Diagnosis not present

## 2019-05-15 DIAGNOSIS — Z111 Encounter for screening for respiratory tuberculosis: Secondary | ICD-10-CM | POA: Diagnosis not present

## 2019-06-30 ENCOUNTER — Encounter: Payer: Self-pay | Admitting: Certified Nurse Midwife

## 2019-07-28 ENCOUNTER — Other Ambulatory Visit: Payer: Self-pay

## 2019-07-28 NOTE — Progress Notes (Signed)
49 y.o. [id number] Single Black or African American female here for annual exam.  Has not had any vaginal bleeding in about two years but she had vaginal spotting last week.  PCP:  Antony Contras.  Last visit was virtual and was in 2020.  Did blood work before to the appt.  Was started back on statin.    Patient's last menstrual period was 07/09/2017 (approximate).          Sexually active: No.  The current method of family planning is abstinence.    Exercising: Yes.    jumps rope Smoker:  no  Health Maintenance: Pap: 05-08-18 Neg:Neg HR HPV, 06-27-16 Neg:Pos HR HPV--Neg 16/18/45 History of abnormal Pap:Yes, 06-27-16 Neg:Pos HR HPV--Neg 16/18/45 MMG: 09-21-18 3D/Neg/density B/BiRads1 Colonoscopy: 09/2018 normal per patient, follow up 10 years BMD:   n/a TDaP:  Our records show 2011, pt feels it is up to date Pneumonia vaccine(s):  no Shingrix:  Advised to wait until after having Shingrix vaccination Hep C testing: 05-08-18 Neg Hep C and HIV Screening Labs: does with Dr. Moreen Fowler   reports that she has never smoked. She has never used smokeless tobacco. She reports that she does not drink alcohol or use drugs.  Past Medical History:  Diagnosis Date  . Abnormal Pap smear of cervix    neg HPV HR 16,18/45  . Blood transfusion without reported diagnosis   . Hypertension   . PONV (postoperative nausea and vomiting)     Past Surgical History:  Procedure Laterality Date  . BREAST REDUCTION SURGERY     2003  . CESAREAN SECTION    . REDUCTION MAMMAPLASTY Bilateral 2003  . WISDOM TOOTH EXTRACTION      Current Outpatient Medications  Medication Sig Dispense Refill  . fluticasone (FLONASE) 50 MCG/ACT nasal spray Place 2 sprays into both nostrils daily. 16 g 0  . lisinopril-hydrochlorothiazide (ZESTORETIC) 20-12.5 MG tablet Take 1 tablet by mouth daily. 90 tablet 3  . rosuvastatin (CRESTOR) 10 MG tablet Take 10 mg by mouth daily.     No current facility-administered medications for this visit.     Family History  Problem Relation Age of Onset  . Hypertension Mother   . Stroke Mother   . Hypertension Father   . Other Sister 63       DEC MVA    Review of Systems  All other systems reviewed and are negative.   Exam:   BP (!) 150/92 (Cuff Size: Large) Comment: patient hasn't taken b/p med yet this AM  Pulse 70   Temp (!) 97.1 F (36.2 C) (Temporal)   Resp 16   Ht 5\' 3"  (1.6 m)   Wt 224 lb (101.6 kg)   LMP 07/09/2017 (Approximate)   BMI 39.68 kg/m    Height: 5\' 3"  (160 cm)  Ht Readings from Last 3 Encounters:  07/29/19 5\' 3"  (1.6 m)  12/27/18 5' 2.5" (1.588 m)  05/08/18 5' 2.5" (1.588 m)    General appearance: alert, cooperative and appears stated age Head: Normocephalic, without obvious abnormality, atraumatic Neck: no adenopathy, supple, symmetrical, trachea midline and thyroid normal to inspection and palpation Lungs: clear to auscultation bilaterally Breasts: normal appearance, no masses or tenderness Heart: regular rate and rhythm Abdomen: soft, non-tender; bowel sounds normal; no masses,  no organomegaly Extremities: extremities normal, atraumatic, no cyanosis or edema Skin: Skin color, texture, turgor normal. No rashes or lesions Lymph nodes: Cervical, supraclavicular, and axillary nodes normal. No abnormal inguinal nodes palpated Neurologic: Grossly normal  Pelvic: External genitalia:  no lesions              Urethra:  normal appearing urethra with no masses, tenderness or lesions              Bartholins and Skenes: normal                 Vagina: normal appearing vagina with normal color and discharge, no lesions              Cervix: no lesions              Pap taken: Yes.   Bimanual Exam:  Uterus:  normal size, contour, position, consistency, mobility, non-tender              Adnexa: normal adnexa and no mass, fullness, tenderness               Rectovaginal: Confirms               Anus:  normal sphincter tone, no lesions  Chaperone, Zenovia Jordan, CMA, was present for exam.  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 pap smear with HR HPV obtained today Release for colonoscopy from [facility]. Estradiol and FSH obtained today BMD recommended after 55 Return annually or prn

## 2019-07-29 ENCOUNTER — Other Ambulatory Visit (HOSPITAL_COMMUNITY)
Admission: RE | Admit: 2019-07-29 | Discharge: 2019-07-29 | Disposition: A | Discharge status: Home / Self Care | Payer: BC Managed Care – PPO | Source: Ambulatory Visit | Attending: Obstetrics & Gynecology | Admitting: Obstetrics & Gynecology

## 2019-07-29 ENCOUNTER — Ambulatory Visit (INDEPENDENT_AMBULATORY_CARE_PROVIDER_SITE_OTHER): Payer: BC Managed Care – PPO | Admitting: Obstetrics & Gynecology

## 2019-07-29 ENCOUNTER — Encounter: Payer: Self-pay | Admitting: Obstetrics & Gynecology

## 2019-07-29 VITALS — BP 150/92 | HR 70 | Temp 97.1°F | Resp 16 | Ht 63.0 in | Wt 224.0 lb

## 2019-07-29 DIAGNOSIS — R87810 Cervical high risk human papillomavirus (HPV) DNA test positive: Secondary | ICD-10-CM

## 2019-07-29 DIAGNOSIS — N950 Postmenopausal bleeding: Secondary | ICD-10-CM | POA: Diagnosis not present

## 2019-07-29 DIAGNOSIS — Z01419 Encounter for gynecological examination (general) (routine) without abnormal findings: Secondary | ICD-10-CM | POA: Diagnosis not present

## 2019-07-30 LAB — ESTRADIOL: Estradiol: <5 pg/mL

## 2019-07-30 LAB — CYTOLOGY - PAP
Adequacy: ABSENT
Comment: NEGATIVE
Diagnosis: NEGATIVE
High risk HPV: NEGATIVE

## 2019-07-30 LAB — FOLLICLE STIMULATING HORMONE: FSH: 31.9 m[IU]/mL

## 2019-08-05 ENCOUNTER — Other Ambulatory Visit: Payer: Self-pay

## 2019-08-05 ENCOUNTER — Ambulatory Visit (HOSPITAL_COMMUNITY)
Admission: EM | Admit: 2019-08-05 | Discharge: 2019-08-05 | Disposition: A | Discharge status: Home / Self Care | Payer: BC Managed Care – PPO | Attending: Physician Assistant | Admitting: Physician Assistant

## 2019-08-05 ENCOUNTER — Encounter (HOSPITAL_COMMUNITY): Payer: Self-pay | Admitting: Emergency Medicine

## 2019-08-05 DIAGNOSIS — M7918 Myalgia, other site: Secondary | ICD-10-CM | POA: Diagnosis not present

## 2019-08-05 MED ORDER — TIZANIDINE HCL 4 MG PO TABS: 4.0000 mg | ORAL_TABLET | Freq: Four times a day (QID) | ORAL | 0 refills | Status: DC | PRN
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
## 2019-08-22 ENCOUNTER — Other Ambulatory Visit: Payer: Self-pay | Admitting: Family Medicine

## 2019-08-22 DIAGNOSIS — Z1231 Encounter for screening mammogram for malignant neoplasm of breast: Secondary | ICD-10-CM

## 2019-09-09 ENCOUNTER — Ambulatory Visit (HOSPITAL_COMMUNITY)
Admission: EM | Admit: 2019-09-09 | Discharge: 2019-09-09 | Disposition: A | Discharge status: Home / Self Care | Payer: BC Managed Care – PPO | Attending: Physician Assistant | Admitting: Physician Assistant

## 2019-09-09 ENCOUNTER — Encounter (HOSPITAL_COMMUNITY): Payer: Self-pay

## 2019-09-09 ENCOUNTER — Other Ambulatory Visit: Payer: Self-pay

## 2019-09-09 DIAGNOSIS — L02413 Cutaneous abscess of right upper limb: Secondary | ICD-10-CM

## 2019-09-09 MED ORDER — LIDOCAINE-EPINEPHRINE (PF) 2 %-1:200000 IJ SOLN
INTRAMUSCULAR | Status: AC
  Filled 2019-09-09: qty 20

## 2019-09-09 MED ORDER — DOXYCYCLINE HYCLATE 100 MG PO CAPS: 100.0000 mg | ORAL_CAPSULE | Freq: Two times a day (BID) | ORAL | 0 refills | Status: DC

## 2019-09-09 NOTE — ED Provider Notes (Signed)
[place]    CSN: [id number] Arrival date & time: 09/09/19  [place]      History   Chief Complaint Chief Complaint  Patient presents with  . Joint Swelling    HPI Pari D Chriswell is a 49 y.o. female.   Patient presents for evaluation of right elbow pain and swelling.  She reports she had an area of swelling and pain on the back of her right elbow is been developing over the last few days.  She reports the pain has become much more worse.  She reports it hurts with movement and to touch.  She reports it has been warm.  Nothing is drained so far.  She does endorse some chills.  Denies fever.  Denies pain on the front of the elbow.     Past Medical History:  Diagnosis Date  . Abnormal Pap smear of cervix    neg HPV HR 16,18/45  . Blood transfusion without reported diagnosis   . Hypertension   . PONV (postoperative nausea and vomiting)     Patient Active Problem List   Diagnosis Date Noted  . Hypertension 08/21/2015  . BMI 39.0-39.9,adult 08/21/2015  . DYSPNEA 05/03/2007    Past Surgical History:  Procedure Laterality Date  . BREAST REDUCTION SURGERY     2003  . CESAREAN SECTION    . REDUCTION MAMMAPLASTY Bilateral 2003  . WISDOM TOOTH EXTRACTION      OB History    Gravida  6   Para  3   Term  3   Preterm      AB  3   Living  3     SAB  1   TAB  2   Ectopic      Multiple      Live Births               Home Medications    Prior to Admission medications   Medication Sig Start Date End Date Taking? Authorizing Provider  lisinopril-hydrochlorothiazide (ZESTORETIC) 20-12.5 MG tablet Take 1 tablet by mouth daily. 08/21/15  Yes Leandrew Koyanagi, MD  rosuvastatin (CRESTOR) 10 MG tablet Take 10 mg by mouth daily. 07/11/19  Yes [provider]  doxycycline (VIBRAMYCIN) 100 MG capsule Take 1 capsule (100 mg total) by mouth 2 (two) times daily. 09/09/19   Abdulkareem Badolato, Marguerita Beards, PA-C  fluticasone (FLONASE) 50 MCG/ACT nasal spray Place 2 sprays  into both nostrils daily. 12/07/17   Wurst, [place], PA-C  tiZANidine (ZANAFLEX) 4 MG tablet Take 1 tablet (4 mg total) by mouth every 6 (six) hours as needed for muscle spasms. 08/05/19   Minnette Merida, Marguerita Beards, PA-C    Family History Family History  Problem Relation Age of Onset  . Hypertension Mother   . Stroke Mother   . Hypertension Father   . Other Sister 64       DEC MVA    Social History Social History   Tobacco Use  . Smoking status: Never Smoker  . Smokeless tobacco: Never Used  Substance Use Topics  . Alcohol use: No  . Drug use: No     Allergies   Aspirin   Review of Systems Review of Systems   Physical Exam Triage Vital Signs ED Triage Vitals  Enc Vitals Group     BP 09/09/19 1648 (!) 155/97     Pulse Rate 09/09/19 1648 91     Resp 09/09/19 1648 16     Temp 09/09/19 1648 98.4 F (36.9  C)     Temp Source 09/09/19 1648 Oral     SpO2 09/09/19 1648 98 %     Weight --      Height --      Head Circumference --      Peak Flow --      Pain Score 09/09/19 1649 7     Pain Loc --      Pain Edu? --      Excl. in GC? --    No data found.  Updated Vital Signs BP (!) 155/97 (BP Location: Left Arm)   Pulse 91   Temp 98.4 F (36.9 C) (Oral)   Resp 16   SpO2 98%   Visual Acuity Right Eye Distance:   Left Eye Distance:   Bilateral Distance:    Right Eye Near:   Left Eye Near:    Bilateral Near:     Physical Exam Vitals and nursing note reviewed.  Constitutional:      Appearance: Normal appearance.  Cardiovascular:     Rate and Rhythm: Normal rate.  Pulmonary:     Effort: Pulmonary effort is normal. No respiratory distress.  Musculoskeletal:       Arms:     Comments: Area marked on diagram approximately 2 to centimeter in diameter area of induration with a central area of fluctuance and a head.  Very tender to touch.  Patient does have full range of motion of the elbow however there is pain elicited with terminal flexion.  No anterior elbow pain or  swelling.  Good distal pulses and cap refill.  Neurological:     Mental Status: She is alert.      UC Treatments / Results  Labs (all labs ordered are listed, but only abnormal results are displayed) Labs Reviewed - No data to display  EKG   Radiology No results found.  Procedures Incision and Drainage  Date/Time: 09/09/2019 9:17 PM Performed by: Hermelinda Medicus, PA-C Authorized by: Hermelinda Medicus, PA-C   Consent:    Consent obtained:  Verbal   Consent given by:  Patient   Risks discussed:  Bleeding, pain and incomplete drainage   Alternatives discussed:  Alternative treatment Location:    Type:  Abscess   Location:  Upper extremity   Upper extremity location:  Elbow   Elbow location:  R elbow Pre-procedure details:    Skin preparation:  Betadine Anesthesia (see MAR for exact dosages):    Anesthesia method:  Local infiltration   Local anesthetic:  Lidocaine 2% WITH epi Procedure type:    Complexity:  Simple Procedure details:    Needle aspiration: no     Incision types:  Single straight   Scalpel blade:  11   Wound management:  Probed and deloculated   Drainage:  Bloody and purulent   Drainage amount:  Scant   Wound treatment:  Wound left open Post-procedure details:    Patient tolerance of procedure:  Tolerated well, no immediate complications   (including critical care time)  Medications Ordered in UC Medications - No data to display  Initial Impression / Assessment and Plan / UC Course  I have reviewed the triage vital signs and the nursing notes.  Pertinent labs & imaging results that were available during my care of the patient were reviewed by me and considered in my medical decision making (see chart for details).     #Abscess right elbow Patient is a 49 year old presenting with abscess of right elbow.  Do not believe  there is involvement of the joint or bursa.  Incised and drained.  Wound care was discussed.  Will place on doxycycline.  Discussed  recheck in 2 to 3 days to ensure healing.  Signs of infection and return precautions were discussed.  Patient verbalized understanding Final Clinical Impressions(s) / UC Diagnoses   Final diagnoses:  Abscess of arm, right     Discharge Instructions     We have drained the abscess Continue to change dressings daily, apply gentle pressure to express more discharge, this is desired.  Take the doxycycline, 2 times a day for 10 days  Have the wound checked in 2-3 days either with your Primary care or this clinic  If worsening swelling, severe pain or high fevers please return prior to wound check.      ED Prescriptions    Medication Sig Dispense Auth. Provider   doxycycline (VIBRAMYCIN) 100 MG capsule Take 1 capsule (100 mg total) by mouth 2 (two) times daily. 20 capsule Griffon Herberg, Veryl Speak, PA-C     PDMP not reviewed this encounter.   Hermelinda Medicus, PA-C 09/09/19 2118

## 2019-09-09 NOTE — ED Triage Notes (Signed)
Pt presents with complaints of swelling to her right elbow x 4 days. States the area is painful. Denies any injury.

## 2019-09-09 NOTE — Discharge Instructions (Addendum)
We have drained the abscess Continue to change dressings daily, apply gentle pressure to express more discharge, this is desired.  Take the doxycycline, 2 times a day for 10 days  Have the wound checked in 2-3 days either with your Primary care or this clinic  If worsening swelling, severe pain or high fevers please return prior to wound check.

## 2019-09-22 ENCOUNTER — Ambulatory Visit: Payer: BC Managed Care – PPO

## 2019-09-26 ENCOUNTER — Telehealth: Payer: Self-pay

## 2019-09-26 NOTE — Telephone Encounter (Signed)
Spoke with patient. Patient recently completed abx for "boil" on her elbow. Reports vaginal itching that started 3 days ago. Denies vaginal d/c or odor. Has tried OTC monistat 1 day, no change in symptoms. Patient is requesting Rx for diflucan.   Advised patient OV needed for further evaluation, patient declined OV. Patient states she was just in the office, "no change in habits, just need something for yeast, will try OTC monistat again". Advised patient if symptoms do not resolve, return call to office for OV, advised Dr. Hyacinth Meeker will review, our office will return call if any additional recommendations. Patient verbalizes understanding.   Last AEX 07/29/19  Routing to provider for final review. Patient is agreeable to disposition. Will close encounter.

## 2019-09-26 NOTE — Telephone Encounter (Signed)
Left message to call Noreene Larsson, RN at [facility] [phone].   Last AEX 07/29/19

## 2019-09-26 NOTE — Telephone Encounter (Signed)
Patient is calling in regards to yeast infection.  °

## 2019-10-09 DIAGNOSIS — Z0001 Encounter for general adult medical examination with abnormal findings: Secondary | ICD-10-CM | POA: Diagnosis not present

## 2019-10-29 DIAGNOSIS — M1612 Unilateral primary osteoarthritis, left hip: Secondary | ICD-10-CM | POA: Diagnosis not present

## 2019-10-29 DIAGNOSIS — M545 Low back pain: Secondary | ICD-10-CM | POA: Diagnosis not present

## 2019-10-29 DIAGNOSIS — M25552 Pain in left hip: Secondary | ICD-10-CM | POA: Diagnosis not present

## 2019-12-22 NOTE — Progress Notes (Signed)
GYNECOLOGY  VISIT  CC:   Vaginal discharge  HPI: 49 y.o. [id number] Single Black or African American female here for new vaginal discharge that started about two weeks.  She reports there is some odor.  Denies itching.  Discharge is brownish in color.  She took some OTC AZO.  Denies urinary symptoms.  She is not currently SA.  Last partner was >5 years ago.    She's lost about 20 pounds since April.  She did this with her church.  She is now off her cholesterol medication and antihypertensives.  Her PCP, Dr. Azucena Cecil, stopped these because she's doing so well with her weight loss.    GYNECOLOGIC HISTORY: No LMP recorded. (Menstrual status: Perimenopausal). Contraception: abstinence Menopausal hormone therapy: none  Patient Active Problem List   Diagnosis Date Noted  . Hypertension 08/21/2015  . BMI 39.0-39.9,adult 08/21/2015  . DYSPNEA 05/03/2007    Past Medical History:  Diagnosis Date  . Abnormal Pap smear of cervix    neg HPV HR 16,18/45  . Blood transfusion without reported diagnosis   . Hypertension   . PONV (postoperative nausea and vomiting)     Past Surgical History:  Procedure Laterality Date  . BREAST REDUCTION SURGERY     2003  . CESAREAN SECTION    . REDUCTION MAMMAPLASTY Bilateral 2003  . WISDOM TOOTH EXTRACTION      MEDS:   No current outpatient medications on file prior to visit.   No current facility-administered medications on file prior to visit.    ALLERGIES: Aspirin  Family History  Problem Relation Age of Onset  . Hypertension Mother   . Stroke Mother   . Hypertension Father   . Other Sister 81       DEC MVA    SH:  Single, non smoker  Review of Systems  Constitutional: Negative.   HENT: Negative.   Eyes: Negative.   Respiratory: Negative.   Cardiovascular: Negative.   Gastrointestinal: Negative.   Endocrine: Negative.   Genitourinary:       Discharge with odor  Musculoskeletal: Negative.   Skin: Negative.   Allergic/Immunologic:  Negative.   Neurological: Negative.   Hematological: Negative.   Psychiatric/Behavioral: Negative.     PHYSICAL EXAMINATION:    BP 110/70   Pulse 68   Resp 16   Wt 208 lb (94.3 kg)   BMI 36.85 kg/m     General appearance: alert, cooperative and appears stated age Abdomen: soft, non-tender; bowel sounds normal; no masses,  no organomegaly Lymph:  no inguinal LAD noted  Pelvic: External genitalia:  no lesions              Urethra:  normal appearing urethra with no masses, tenderness or lesions              Bartholins and Skenes: normal                 Vagina: significant yellowish vaginal discharge present, no lesions, atrophy present as well              Cervix: no lesions              Bimanual Exam:  Uterus:  normal size, contour, position, consistency, mobility, non-tender              Adnexa: no mass, fullness, tenderness  Chaperone, Delton Coombes, CMA, was present for exam.  Assessment: Vaginal discharge with significant amount noted on exam today Possible atrophic changes are contributing  Plan: Affirm  obtained As I think this may actually show something, will wait to start treatment.  If this is negative, we did discuss treatment for atrophic changes and she is comfortable with using vaginal estrogen cream, instructions and risks reviewed   21 minutes of total time was spent for this patient encounter, including face-to-face counseling with the patient and coordination of care, and documentation of the encounter.

## 2019-12-23 ENCOUNTER — Encounter: Payer: Self-pay | Admitting: Obstetrics & Gynecology

## 2019-12-23 ENCOUNTER — Ambulatory Visit (INDEPENDENT_AMBULATORY_CARE_PROVIDER_SITE_OTHER): Payer: BC Managed Care – PPO | Admitting: Obstetrics & Gynecology

## 2019-12-23 ENCOUNTER — Other Ambulatory Visit: Payer: Self-pay

## 2019-12-23 VITALS — BP 110/70 | HR 68 | Resp 16 | Wt 208.0 lb

## 2019-12-23 DIAGNOSIS — N898 Other specified noninflammatory disorders of vagina: Secondary | ICD-10-CM

## 2019-12-24 ENCOUNTER — Telehealth: Payer: Self-pay

## 2019-12-24 LAB — VAGINITIS/VAGINOSIS, DNA PROBE
Candida Species: NEGATIVE
Gardnerella vaginalis: POSITIVE — AB
Trichomonas vaginosis: POSITIVE — AB

## 2019-12-24 NOTE — Telephone Encounter (Signed)
12/23/19 vaginitis testing positive for BV and trichomonas.   Routing to Dr. Hyacinth Meeker to advise.

## 2019-12-24 NOTE — Telephone Encounter (Signed)
Patient would like to discuss results. 

## 2019-12-24 NOTE — Telephone Encounter (Signed)
Left message to call Damon Baisch, RN at [facility] [phone].   

## 2019-12-24 NOTE — Telephone Encounter (Signed)
Pt needs to be treated with Flagyl 500mg  bid x 7 days.  She reported she has not been SA in several years and she did have negative testing for this.  She will likely want to know how it is positive.  My only answer (if there really is no possible way) is that this is a false positive.  Every test with a positive/negative result has the possibility of having a false positive or false negative.  Both the trichomonas and BV are treated with the flagyl dosage.  Please recommend no ETOH.  Please let me know if I need to call her and I will tomorrow.  I would also recommend TOC in 1 month just to be sure this fully resolves.

## 2019-12-25 MED ORDER — METRONIDAZOLE 500 MG PO TABS: 500.0000 mg | ORAL_TABLET | Freq: Two times a day (BID) | ORAL | 0 refills | Status: DC

## 2019-12-25 NOTE — Telephone Encounter (Signed)
Spoke with patient, advised of results and recommendations as seen below per Dr. Hyacinth Meeker.  Flagyl Rx to verified pharmacy.  ETOH precautions reviewed.  OV scheduled for 02/03/20 at 11:30am.  Questions answered. Patient thankful for call. Encounter closed.

## 2019-12-26 ENCOUNTER — Encounter: Payer: Self-pay | Admitting: Obstetrics & Gynecology

## 2020-01-29 ENCOUNTER — Telehealth: Payer: Self-pay

## 2020-01-29 NOTE — Telephone Encounter (Signed)
Can you please call this pt next week and see if we can get her rescheduled for her TOC?  Thanks.

## 2020-01-29 NOTE — Telephone Encounter (Signed)
Patient cancelled [facility] appointment and will call back to reschedule.

## 2020-02-03 ENCOUNTER — Ambulatory Visit: Payer: Self-pay | Admitting: Obstetrics & Gynecology

## 2020-02-09 NOTE — Telephone Encounter (Signed)
Routed to Perrin, cma

## 2020-02-09 NOTE — Telephone Encounter (Signed)
I called patient & she was at work. She stated she would call back to schedule. She is aware Dr Garen Lah last day is 02-27-2020

## 2020-04-08 ENCOUNTER — Ambulatory Visit: Payer: BC Managed Care – PPO

## 2020-04-22 ENCOUNTER — Other Ambulatory Visit: Payer: Self-pay

## 2020-04-22 ENCOUNTER — Ambulatory Visit
Admission: RE | Admit: 2020-04-22 | Discharge: 2020-04-22 | Disposition: A | Discharge status: Home / Self Care | Payer: BC Managed Care – PPO | Source: Ambulatory Visit | Attending: Family Medicine | Admitting: Family Medicine

## 2020-04-22 DIAGNOSIS — Z1231 Encounter for screening mammogram for malignant neoplasm of breast: Secondary | ICD-10-CM

## 2020-09-13 ENCOUNTER — Encounter (HOSPITAL_COMMUNITY): Payer: Self-pay

## 2020-09-13 ENCOUNTER — Other Ambulatory Visit: Payer: Self-pay

## 2020-09-13 ENCOUNTER — Ambulatory Visit (INDEPENDENT_AMBULATORY_CARE_PROVIDER_SITE_OTHER): Payer: BC Managed Care – PPO

## 2020-09-13 ENCOUNTER — Ambulatory Visit (HOSPITAL_COMMUNITY)
Admission: EM | Admit: 2020-09-13 | Discharge: 2020-09-13 | Disposition: A | Discharge status: Home / Self Care | Payer: BC Managed Care – PPO | Attending: Family Medicine | Admitting: Family Medicine

## 2020-09-13 DIAGNOSIS — M1990 Unspecified osteoarthritis, unspecified site: Secondary | ICD-10-CM | POA: Diagnosis not present

## 2020-09-13 DIAGNOSIS — M25561 Pain in right knee: Secondary | ICD-10-CM | POA: Diagnosis not present

## 2020-09-13 DIAGNOSIS — M25461 Effusion, right knee: Secondary | ICD-10-CM | POA: Diagnosis not present

## 2020-09-13 DIAGNOSIS — M1711 Unilateral primary osteoarthritis, right knee: Secondary | ICD-10-CM | POA: Diagnosis not present

## 2020-09-13 MED ORDER — DICLOFENAC SODIUM 75 MG PO TBEC: 75.0000 mg | DELAYED_RELEASE_TABLET | Freq: Two times a day (BID) | ORAL | 0 refills | Status: DC

## 2020-09-13 NOTE — ED Provider Notes (Signed)
[facility]    CSN: [id number] Arrival date & time: 09/13/20  4081      History   Chief Complaint Chief Complaint  Patient presents with  . Knee Pain    HPI Kathy Harrison is a 50 y.o. female.   HPI  Patient presents today with right knee pain following a fall she sustained 2 days ago.  She has been taken over-the-counter anti-inflammatories without relief of pain.  She reports the pain is localized to the medial lateral region of knee. Past Medical History:  Diagnosis Date  . Abnormal Pap smear of cervix    neg HPV HR 16,18/45  . Blood transfusion without reported diagnosis   . Hypertension   . PONV (postoperative nausea and vomiting)     Patient Active Problem List   Diagnosis Date Noted  . Hypertension 08/21/2015  . BMI 39.0-39.9,adult 08/21/2015  . DYSPNEA 05/03/2007    Past Surgical History:  Procedure Laterality Date  . CESAREAN SECTION    . REDUCTION MAMMAPLASTY Bilateral 2003  . WISDOM TOOTH EXTRACTION      OB History    Gravida  6   Para  3   Term  3   Preterm      AB  3   Living  3     SAB  1   IAB  2   Ectopic      Multiple      Live Births               Home Medications    Prior to Admission medications   Medication Sig Start Date End Date Taking? Authorizing Provider  metroNIDAZOLE (FLAGYL) 500 MG tablet Take 1 tablet (500 mg total) by mouth 2 (two) times daily. 12/25/19   Jerene Bears, MD    Family History Family History  Problem Relation Age of Onset  . Hypertension Mother   . Stroke Mother   . Hypertension Father   . Other Sister 36       DEC MVA    Social History Social History   Tobacco Use  . Smoking status: Never Smoker  . Smokeless tobacco: Never Used  Vaping Use  . Vaping Use: Never used  Substance Use Topics  . Alcohol use: No  . Drug use: No     Allergies   Aspirin   Review of Systems Review of Systems Pertinent negatives listed in HPI   Physical Exam Triage Vital  Signs ED Triage Vitals  Enc Vitals Group     BP 09/13/20 1932 (!) 149/103     Pulse Rate 09/13/20 1932 87     Resp 09/13/20 1932 17     Temp 09/13/20 1932 98 F (36.7 C)     Temp Source 09/13/20 1932 Oral     SpO2 09/13/20 1932 98 %     Weight --      Height --      Head Circumference --      Peak Flow --      Pain Score 09/13/20 1931 7     Pain Loc --      Pain Edu? --      Excl. in [place]? --    No data found.  Updated Vital Signs BP (!) 149/103 (BP Location: Right Arm)   Pulse 87   Temp 98 F (36.7 C) (Oral)   Resp 17   SpO2 98%   Visual Acuity Right Eye Distance:   Left Eye Distance:  Bilateral Distance:    Right Eye Near:   Left Eye Near:    Bilateral Near:     Physical Exam Constitutional:      Appearance: Normal appearance. She is obese.  Cardiovascular:     Rate and Rhythm: Normal rate.  Pulmonary:     Effort: Pulmonary effort is normal.     Breath sounds: Normal breath sounds.  Musculoskeletal:     Right knee: Bony tenderness present. No swelling. Normal range of motion. MCL laxity present.  Skin:    Capillary Refill: Capillary refill takes less than 2 seconds.  Neurological:     General: No focal deficit present.     Mental Status: She is alert.  Psychiatric:        Mood and Affect: Mood normal.        Behavior: Behavior normal.        Thought Content: Thought content normal.        Judgment: Judgment normal.      UC Treatments / Results  Labs (all labs ordered are listed, but only abnormal results are displayed) Labs Reviewed - No data to display  EKG   Radiology DG Knee 2 Views Right  Result Date: 09/13/2020 CLINICAL DATA:  Post fall with knee injury. Stepped in a hole in the yard landing on knee. EXAM: RIGHT KNEE - 1-2 VIEW COMPARISON:  None. FINDINGS: No fracture or dislocation. Mild tricompartmental peripheral spurring. There is a small knee joint effusion. Minimal medial tibiofemoral joint space narrowing. No erosion or bone  destruction. No focal soft tissue abnormality. IMPRESSION: 1. Small knee joint effusion without acute osseous abnormality. 2. Mild tricompartmental osteoarthritis. Electronically Signed   By: Narda Rutherford M.D.   On: 09/13/2020 19:51    Procedures Procedures (including critical care time)  Medications Ordered in UC Medications - No data to display  Initial Impression / Assessment and Plan / UC Course  I have reviewed the triage vital signs and the nursing notes.  Pertinent labs & imaging results that were available during my care of the patient were reviewed by me and considered in my medical decision making (see chart for details).     Imaging significant for chronic changes no acute changes however there is a small joint effusion present.  Recommend compression with an Ace wrap and aggressive anti-inflammatory therapy. Final Clinical Impressions(s) / UC Diagnoses   Final diagnoses:  Right knee pain, unspecified chronicity  Arthritis   Discharge Instructions   None    ED Prescriptions    Medication Sig Dispense Auth. Provider   diclofenac (VOLTAREN) 75 MG EC tablet Take 1 tablet (75 mg total) by mouth 2 (two) times daily for 14 days. 28 tablet Bing Neighbors, FNP     PDMP not reviewed this encounter.   Bing Neighbors, FNP 09/13/20 2014

## 2020-09-13 NOTE — ED Triage Notes (Signed)
Pt in with c/o right knee pain that started Saturday after she had a fall   Denies any bruising or swelling

## 2020-09-13 NOTE — ED Notes (Signed)
Tried calling patient from the waiting room, no answer

## 2020-12-06 ENCOUNTER — Other Ambulatory Visit: Payer: Self-pay

## 2020-12-06 ENCOUNTER — Encounter (HOSPITAL_COMMUNITY): Payer: Self-pay | Admitting: Emergency Medicine

## 2020-12-06 ENCOUNTER — Ambulatory Visit (HOSPITAL_COMMUNITY)
Admission: EM | Admit: 2020-12-06 | Discharge: 2020-12-06 | Disposition: A | Discharge status: Home / Self Care | Payer: BC Managed Care – PPO | Attending: Family Medicine | Admitting: Family Medicine

## 2020-12-06 DIAGNOSIS — R1031 Right lower quadrant pain: Secondary | ICD-10-CM

## 2020-12-06 LAB — POCT URINALYSIS DIPSTICK, ED / UC
Bilirubin Urine: NEGATIVE
Glucose, UA: NEGATIVE mg/dL
Hgb urine dipstick: NEGATIVE
Ketones, ur: NEGATIVE mg/dL
Leukocytes,Ua: NEGATIVE
Nitrite: NEGATIVE
Protein, ur: NEGATIVE mg/dL
Specific Gravity, Urine: <=1.005 (ref 1.005–1.030)
Urobilinogen, UA: 0.2 mg/dL (ref 0.0–1.0)
pH: 5 (ref 5.0–8.0)

## 2020-12-06 LAB — POC URINE PREG, ED: Preg Test, Ur: NEGATIVE

## 2020-12-06 NOTE — ED Provider Notes (Addendum)
[facility]   [id number] 12/06/20 Arrival Time: 3500  ASSESSMENT & PLAN:  1. Right lower quadrant abdominal pain    UPT negative. U/A without signs of infection. Benign abdominal exam here. No signs of acute appendicitis/acute abdomen. No indications for urgent abdominal/pelvic imaging at this time. Discussed. Recommend GYN evaluation. Information given.    Discharge Instructions      You have been seen today for abdominal pain. Your evaluation was not suggestive of any emergent condition requiring medical intervention at this time. However, some abdominal problems make take more time to appear. Therefore, it is very important for you to pay attention to any new symptoms or worsening of your current condition.  Please return here or to the Emergency Department immediately should you begin to feel worse in any way or have any of the following symptoms: increasing or different abdominal pain, persistent vomiting, inability to drink fluids, fevers, or shaking chills.       Follow-up Information     Schedule an appointment as soon as possible for a visit  with [facility].   Specialty: Obstetrics and Gynecology Contact information: [address] [place] [address] [phone]        [facility] EMERGENCY DEPARTMENT.   Specialty: Emergency Medicine Why: If symptoms worsen in any way. Contact information: [address] [id number] mc [place] [address] [phone]               Reviewed expectations re: course of current medical issues. Questions answered. Outlined signs and symptoms indicating need for more acute intervention. Patient verbalized understanding. After Visit Summary given.   SUBJECTIVE: History from: patient. Kathy Harrison is a 50 y.o. female who presents with complaint of fairly persistent RLQ abdominal discomfort. Onset  gradual, a week ago. Discomfort described as dull; without radiation; does not wake her at night. Reports normal flatus. Symptoms are stable since beginning. Fever: absent. Aggravating factors: have not been identified. Alleviating factors: have not been identified. Associated symptoms: none reported. She denies chills, constipation, diarrhea, dysuria, fever, headache, melena, myalgias, and sweats. Appetite: normal. PO intake: normal. Ambulatory without assistance. Urinary symptoms: occas frequency. Bowel movements: have not significantly changed; without blood. History of similar: no. OTC treatment: none.  Patient's last menstrual period was 12/09/2017 (exact date).  Past Surgical History:  Procedure Laterality Date   CESAREAN SECTION     REDUCTION MAMMAPLASTY Bilateral 2003   WISDOM TOOTH EXTRACTION      OBJECTIVE:  Vitals:   12/06/20 0936  BP: (!) 168/118  Pulse: 84  Resp: 16  Temp: 98.3 F (36.8 C)  TempSrc: Oral  SpO2: 98%    General appearance: alert, oriented, no acute distress HEENT: [place]; AT; oropharynx moist Lungs: unlabored respirations Abdomen: soft; without distention; mild  and poorly localized tenderness to palpation over RLQ ; normal bowel sounds; without masses or organomegaly; without guarding or rebound tenderness Back: without reported CVA tenderness; FROM at waist Extremities: without LE edema; symmetrical; without gross deformities Skin: warm and dry Neurologic: normal gait Psychological: alert and cooperative; normal mood and affect  Labs: Results for orders placed or performed during the hospital encounter of 12/06/20  POC Urinalysis dipstick  Result Value Ref Range   Glucose, UA NEGATIVE NEGATIVE mg/dL   Bilirubin Urine NEGATIVE NEGATIVE   Ketones, ur NEGATIVE NEGATIVE mg/dL   Specific Gravity, Urine <=1.005 1.005 - 1.030   Hgb urine dipstick NEGATIVE NEGATIVE  pH 5.0 5.0 - 8.0   Protein, ur NEGATIVE NEGATIVE mg/dL   Urobilinogen, UA 0.2 0.0 - 1.0  mg/dL   Nitrite NEGATIVE NEGATIVE   Leukocytes,Ua NEGATIVE NEGATIVE  POC urine pregnancy  Result Value Ref Range   Preg Test, Ur NEGATIVE NEGATIVE   Labs Reviewed  POCT URINALYSIS DIPSTICK, ED / UC  POC URINE PREG, ED     Allergies  Allergen Reactions   Aspirin Nausea And Vomiting                                               Past Medical History:  Diagnosis Date   Abnormal Pap smear of cervix    neg HPV HR 16,18/45   Blood transfusion without reported diagnosis    Hypertension    PONV (postoperative nausea and vomiting)     Social History   Socioeconomic History   Marital status: Single    Spouse name: Not on file   Number of children: Not on file   Years of education: Not on file   Highest education level: Not on file  Occupational History   Not on file  Tobacco Use   Smoking status: Never   Smokeless tobacco: Never  Vaping Use   Vaping Use: Never used  Substance and Sexual Activity   Alcohol use: No   Drug use: No   Sexual activity: Not Currently    Partners: Male    Birth control/protection: Abstinence  Other Topics Concern   Not on file  Social History Narrative   Not on file   Social Determinants of Health   Financial Resource Strain: Not on file  Food Insecurity: Not on file  Transportation Needs: Not on file  Physical Activity: Not on file  Stress: Not on file  Social Connections: Not on file  Intimate Partner Violence: Not on file    Family History  Problem Relation Age of Onset   Hypertension Mother    Stroke Mother    Hypertension Father    Other Sister 58       DEC MVA     Mardella Layman, MD 12/06/20 1047    Mardella Layman, MD 12/06/20 1048

## 2020-12-06 NOTE — Discharge Instructions (Signed)

## 2020-12-06 NOTE — ED Triage Notes (Signed)
PT reports dull, constant abdominal pain in RLQ that wraps around to right lower back. Present for 1 week. Denies N/V/D.

## 2021-01-10 DIAGNOSIS — M9903 Segmental and somatic dysfunction of lumbar region: Secondary | ICD-10-CM | POA: Diagnosis not present

## 2021-01-10 DIAGNOSIS — M25561 Pain in right knee: Secondary | ICD-10-CM | POA: Diagnosis not present

## 2021-01-10 DIAGNOSIS — M9905 Segmental and somatic dysfunction of pelvic region: Secondary | ICD-10-CM | POA: Diagnosis not present

## 2021-01-10 DIAGNOSIS — M9901 Segmental and somatic dysfunction of cervical region: Secondary | ICD-10-CM | POA: Diagnosis not present

## 2021-01-10 DIAGNOSIS — M5451 Vertebrogenic low back pain: Secondary | ICD-10-CM | POA: Diagnosis not present

## 2021-01-10 DIAGNOSIS — M9906 Segmental and somatic dysfunction of lower extremity: Secondary | ICD-10-CM | POA: Diagnosis not present

## 2021-03-29 ENCOUNTER — Ambulatory Visit (HOSPITAL_COMMUNITY)
Admission: EM | Admit: 2021-03-29 | Discharge: 2021-03-29 | Disposition: A | Discharge status: Home / Self Care | Payer: BC Managed Care – PPO | Attending: Emergency Medicine | Admitting: Emergency Medicine

## 2021-03-29 ENCOUNTER — Other Ambulatory Visit: Payer: Self-pay

## 2021-03-29 DIAGNOSIS — U071 COVID-19: Secondary | ICD-10-CM | POA: Diagnosis not present

## 2021-03-29 DIAGNOSIS — R059 Cough, unspecified: Secondary | ICD-10-CM | POA: Diagnosis present

## 2021-03-29 DIAGNOSIS — R0981 Nasal congestion: Secondary | ICD-10-CM | POA: Insufficient documentation

## 2021-03-29 DIAGNOSIS — R519 Headache, unspecified: Secondary | ICD-10-CM | POA: Diagnosis not present

## 2021-03-29 DIAGNOSIS — J069 Acute upper respiratory infection, unspecified: Secondary | ICD-10-CM | POA: Diagnosis not present

## 2021-03-29 LAB — RESPIRATORY PANEL BY PCR

## 2021-03-29 NOTE — Discharge Instructions (Signed)
You can take 10 mg of Zyrtec once daily. You do 1 spray of Flonase on each side.

## 2021-03-29 NOTE — ED Provider Notes (Signed)
[employer]  ____________________________________________  Time seen: Approximately 5:58 PM  I have reviewed the triage vital signs and the nursing notes.   HISTORY  Chief Complaint Headache   Historian Patient     HPI Kathy Harrison is a 50 y.o. female presents to the urgent care with cough, nasal congestion and headache.  Daughter has similar symptoms.  He would like COVID and flu testing.  No chest pain, chest tightness or abdominal pain.   Past Medical History:  Diagnosis Date   Abnormal Pap smear of cervix    neg HPV HR 16,18/45   Blood transfusion without reported diagnosis    Hypertension    PONV (postoperative nausea and vomiting)      Immunizations up to date:  Yes.     Past Medical History:  Diagnosis Date   Abnormal Pap smear of cervix    neg HPV HR 16,18/45   Blood transfusion without reported diagnosis    Hypertension    PONV (postoperative nausea and vomiting)     Patient Active Problem List   Diagnosis Date Noted   Hypertension 08/21/2015   BMI 39.0-39.9,adult 08/21/2015   DYSPNEA 05/03/2007    Past Surgical History:  Procedure Laterality Date   CESAREAN SECTION     REDUCTION MAMMAPLASTY Bilateral 2003   WISDOM TOOTH EXTRACTION      Prior to Admission medications   Medication Sig Start Date End Date Taking? Authorizing Provider  metroNIDAZOLE (FLAGYL) 500 MG tablet Take 1 tablet (500 mg total) by mouth 2 (two) times daily. 12/25/19   Megan Salon, MD    Allergies Aspirin  Family History  Problem Relation Age of Onset   Hypertension Mother    Stroke Mother    Hypertension Father    Other Sister 53       DEC MVA    Social History Social History   Tobacco Use   Smoking status: Never   Smokeless tobacco: Never  Vaping Use   Vaping Use: Never used  Substance Use Topics   Alcohol use: No   Drug use: No      Review of Systems  Constitutional: Patient has fever.  Eyes: No visual changes. No discharge ENT:  Patient has congestion.  Cardiovascular: no chest pain. Respiratory: Patient has cough.  Gastrointestinal: No abdominal pain.  No nausea, no vomiting. Patient had diarrhea.  Genitourinary: Negative for dysuria. No hematuria Musculoskeletal: Patient has myalgias.  Skin: Negative for rash, abrasions, lacerations, ecchymosis. Neurological: Patient has headache, no focal weakness or numbness.   ____________________________________________   PHYSICAL EXAM:  VITAL SIGNS: ED Triage Vitals  Enc Vitals Group     BP 03/29/21 1729 (S) (!) 155/102     Pulse Rate 03/29/21 1729 88     Resp 03/29/21 1729 16     Temp 03/29/21 1729 98.7 F (37.1 C)     Temp Source 03/29/21 1729 Oral     SpO2 03/29/21 1729 98 %     Weight --      Height --      Head Circumference --      Peak Flow --      Pain Score 03/29/21 1730 0     Pain Loc --      Pain Edu? --      Excl. in [place]? --      Constitutional: Alert and oriented. Patient is lying supine. Eyes: Conjunctivae are normal. PERRL. EOMI. Head: Atraumatic. ENT:      Ears: Tympanic membranes  are mildly injected with mild effusion bilaterally.       Nose: No congestion/rhinnorhea.      Mouth/Throat: Mucous membranes are moist. Posterior pharynx is mildly erythematous.  Hematological/Lymphatic/Immunilogical: No cervical lymphadenopathy.  Cardiovascular: Normal rate, regular rhythm. Normal S1 and S2.  Good peripheral circulation. Respiratory: Normal respiratory effort without tachypnea or retractions. Lungs CTAB. Good air entry to the bases with no decreased or absent breath sounds. Gastrointestinal: Bowel sounds 4 quadrants. Soft and nontender to palpation. No guarding or rigidity. No palpable masses. No distention. No CVA tenderness. Musculoskeletal: Full range of motion to all extremities. No gross deformities appreciated. Neurologic:  Normal speech and language. No gross focal neurologic deficits are appreciated.  Skin:  Skin is warm, dry and  intact. No rash noted. Psychiatric: Mood and affect are normal. Speech and behavior are normal. Patient exhibits appropriate insight and judgement.   ____________________________________________   LABS (all labs ordered are listed, but only abnormal results are displayed)  Labs Reviewed  SARS CORONAVIRUS 2 (TAT 6-24 HRS)  RESPIRATORY PANEL BY PCR   ____________________________________________  EKG   ____________________________________________  RADIOLOGY   No results found.  ____________________________________________    PROCEDURES  Procedure(s) performed:     Procedures     Medications - No data to display   ____________________________________________   INITIAL IMPRESSION / ASSESSMENT AND PLAN / ED COURSE  Pertinent labs & imaging results that were available during my care of the patient were reviewed by me and considered in my medical decision making (see chart for details).    Assessment and plan Viral URI 50 year old female presents to the urgent care with viral URI-like symptoms.  Testing for COVID-19 and influenza are in process at this time.  Rest and hydration were encouraged at home.  Tylenol and ibuprofen alternating were recommended for headache and fever.      ____________________________________________  FINAL CLINICAL IMPRESSION(S) / ED DIAGNOSES  Final diagnoses:  Viral URI      NEW MEDICATIONS STARTED DURING THIS VISIT:  ED Discharge Orders     None           This chart was dictated using voice recognition software/Dragon. Despite best efforts to proofread, errors can occur which can change the meaning. Any change was purely unintentional.     Orvil Feil, PA-C 03/29/21 1800

## 2021-03-29 NOTE — ED Triage Notes (Signed)
Patient presents to Urgent Care with complaints of chest discomfort, cough, and headache since Saturday. Treating symptoms with robitussin and cold/flu med. With some relief.   Denies fever.

## 2021-03-30 LAB — SARS CORONAVIRUS 2 (TAT 6-24 HRS): SARS Coronavirus 2: POSITIVE — AB

## 2021-04-13 ENCOUNTER — Ambulatory Visit (HOSPITAL_COMMUNITY)
Admission: EM | Admit: 2021-04-13 | Discharge: 2021-04-13 | Disposition: A | Discharge status: Home / Self Care | Payer: BC Managed Care – PPO | Attending: Physician Assistant | Admitting: Physician Assistant

## 2021-04-13 ENCOUNTER — Encounter (HOSPITAL_COMMUNITY): Payer: Self-pay

## 2021-04-13 ENCOUNTER — Other Ambulatory Visit: Payer: Self-pay

## 2021-04-13 DIAGNOSIS — J40 Bronchitis, not specified as acute or chronic: Secondary | ICD-10-CM

## 2021-04-13 DIAGNOSIS — R030 Elevated blood-pressure reading, without diagnosis of hypertension: Secondary | ICD-10-CM | POA: Diagnosis not present

## 2021-04-13 DIAGNOSIS — J329 Chronic sinusitis, unspecified: Secondary | ICD-10-CM | POA: Diagnosis not present

## 2021-04-13 DIAGNOSIS — R051 Acute cough: Secondary | ICD-10-CM

## 2021-04-13 MED ORDER — PREDNISONE 10 MG (21) PO TBPK: ORAL_TABLET | ORAL | 0 refills | Status: DC

## 2021-04-13 MED ORDER — ALBUTEROL SULFATE HFA 108 (90 BASE) MCG/ACT IN AERS: 1.0000 | INHALATION_SPRAY | Freq: Four times a day (QID) | RESPIRATORY_TRACT | 0 refills | Status: DC | PRN

## 2021-04-13 MED ORDER — AMOXICILLIN-POT CLAVULANATE 875-125 MG PO TABS: 1.0000 | ORAL_TABLET | Freq: Two times a day (BID) | ORAL | 0 refills | Status: DC

## 2021-04-13 NOTE — ED Notes (Signed)
Pt states last time she took Robitussin was around 1100. \

## 2021-04-13 NOTE — ED Provider Notes (Signed)
[place]    CSN: [id number] Arrival date & time: 04/13/21  1538      History   Chief Complaint Chief Complaint  Patient presents with   Cough   Nasal Congestion    HPI Kathy Harrison is a 51 y.o. female.   Patient presents today with a week and a half long history of URI symptoms.  She was diagnosed with COVID-19 on 03/30/2021 reports symptoms initially were improving but has had worsening of cough recently prompting reevaluation.  She reports some nasal congestion, headache, severe dry cough.  Denies any fever, nausea, vomiting, chest pain.  She has been taking Robitussin with temporary relief of symptoms.  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  She has had COVID-19 vaccines.  This is her first bout of COVID.  Denies any history of diabetes, asthma, COPD, smoking, immunosuppression, malignancy.  She denies any recent antibiotic use.  Blood pressure is very elevated today.  Patient does report a mild headache but attributes this to severe cough.  Denies any chest pain, vision changes, dizziness, shortness of breath.  She is able to monitor her blood pressure at home.  Denies history of hypertension.   Past Medical History:  Diagnosis Date   Abnormal Pap smear of cervix    neg HPV HR 16,18/45   Blood transfusion without reported diagnosis    Hypertension    PONV (postoperative nausea and vomiting)     Patient Active Problem List   Diagnosis Date Noted   Hypertension 08/21/2015   BMI 39.0-39.9,adult 08/21/2015   DYSPNEA 05/03/2007    Past Surgical History:  Procedure Laterality Date   CESAREAN SECTION     REDUCTION MAMMAPLASTY Bilateral 2003   WISDOM TOOTH EXTRACTION      OB History     Gravida  6   Para  3   Term  3   Preterm      AB  3   Living  3      SAB  1   IAB  2   Ectopic      Multiple      Live Births               Home  Medications    Prior to Admission medications   Medication Sig Start Date End Date Taking? Authorizing Provider  albuterol (VENTOLIN HFA) 108 (90 Base) MCG/ACT inhaler Inhale 1-2 puffs into the lungs every 6 (six) hours as needed for wheezing or shortness of breath. 04/13/21  Yes Nestor Wieneke K, PA-C  amoxicillin-clavulanate (AUGMENTIN) 875-125 MG tablet Take 1 tablet by mouth every 12 (twelve) hours. 04/13/21  Yes Kajah Santizo K, PA-C  predniSONE (STERAPRED UNI-PAK 21 TAB) 10 MG (21) TBPK tablet As directed 04/13/21  Yes Zaeda Mcferran, Derry Skill, PA-C    Family History Family History  Problem Relation Age of Onset   Hypertension Mother    Stroke Mother    Hypertension Father    Other Sister 54       DEC MVA    Social History Social History   Tobacco Use   Smoking status: Never   Smokeless tobacco: Never  Vaping Use   Vaping Use: Never used  Substance Use Topics   Alcohol use: No   Drug use: No     Allergies   Aspirin   Review of Systems Review of Systems  Constitutional:  Positive for  activity change. Negative for appetite change, fatigue and fever.  HENT:  Positive for congestion and sinus pressure. Negative for sneezing and sore throat.   Eyes:  Negative for visual disturbance.  Respiratory:  Positive for cough and chest tightness. Negative for shortness of breath.   Cardiovascular:  Negative for chest pain.  Gastrointestinal:  Negative for abdominal pain, diarrhea, nausea and vomiting.  Neurological:  Positive for headaches. Negative for dizziness and light-headedness.    Physical Exam Triage Vital Signs ED Triage Vitals  Enc Vitals Group     BP 04/13/21 1753 (!) 169/123     Pulse Rate 04/13/21 1753 91     Resp 04/13/21 1753 17     Temp 04/13/21 1753 98.4 F (36.9 C)     Temp Source 04/13/21 1753 Oral     SpO2 04/13/21 1753 100 %     Weight --      Height --      Head Circumference --      Peak Flow --      Pain Score 04/13/21 1752 0     Pain Loc --      Pain Edu?  --      Excl. in [place]? --    No data found.  Updated Vital Signs BP (!) 169/123 (BP Location: Right Arm)    Pulse 91    Temp 98.4 F (36.9 C) (Oral)    Resp 17    LMP 12/09/2017 (Exact Date)    SpO2 100%   Visual Acuity Right Eye Distance:   Left Eye Distance:   Bilateral Distance:    Right Eye Near:   Left Eye Near:    Bilateral Near:     Physical Exam Vitals reviewed.  Constitutional:      General: She is awake. She is not in acute distress.    Appearance: Normal appearance. She is well-developed. She is not ill-appearing.     Comments: Very pleasant female appears stated age in no acute distress sitting comfortably in exam room  HENT:     Head: Normocephalic and atraumatic.     Right Ear: External ear normal.     Left Ear: External ear normal.     Nose:     Right Sinus: No maxillary sinus tenderness or frontal sinus tenderness.     Left Sinus: No maxillary sinus tenderness or frontal sinus tenderness.     Mouth/Throat:     Pharynx: Uvula midline. No oropharyngeal exudate or posterior oropharyngeal erythema.  Cardiovascular:     Rate and Rhythm: Normal rate and regular rhythm.     Heart sounds: Normal heart sounds, S1 normal and S2 normal. No murmur heard. Pulmonary:     Effort: Pulmonary effort is normal.     Breath sounds: Normal breath sounds. No wheezing, rhonchi or rales.     Comments: Reactive cough with deep breathing Psychiatric:        Behavior: Behavior is cooperative.     UC Treatments / Results  Labs (all labs ordered are listed, but only abnormal results are displayed) Labs Reviewed - No data to display  EKG   Radiology No results found.  Procedures Procedures (including critical care time)  Medications Ordered in UC Medications - No data to display  Initial Impression / Assessment and Plan / UC Course  I have reviewed the triage vital signs and the nursing notes.  Pertinent labs & imaging results that were available during my care of the  patient were reviewed by  me and considered in my medical decision making (see chart for details).     Concern for secondary infection following COVID-19 infection given clinical presentation.  Patient was started on Augmentin twice daily for 7 days.  She was given albuterol inhaler with instruction on how to use this during office visit.  She can use this every 4-6 hours as needed for shortness of breath and coughing fits.  We will start prednisone taper and she was instructed to avoid NSAIDs.  Recommended over-the-counter medication including Mucinex, Flonase, Tylenol for additional symptom relief.  She was encouraged to rest and drink plenty of fluid.  Work excuse note provided as requested.  Discussed that if she has any worsening symptoms including high fever, chest pain, shortness of breath, nausea/vomiting interfering with oral intake, worsening cough she needs to go to the emergency room.  Strict return precautions given to which she expressed understanding.  Blood pressure is very elevated today.  Patient attributes this to coughing fits.  She denies any signs/symptoms of endorgan damage with the exception of mild headache which she attributes to coughing; discussed that technically she should go to the emergency room with headache and elevated blood pressure but she declined this as headache is not severe.  Discussed that if she continues to have elevated blood pressure reading particularly if she develops headache, dizziness, chest pain, shortness of breath, vision changes she needs to go to the emergency room.  Recommended she avoid NSAIDs, caffeine, sodium, decongestants.  Recommended follow-up with our clinic or PCP next week for blood pressure recheck.  Final Clinical Impressions(s) / UC Diagnoses   Final diagnoses:  Sinobronchitis  Acute cough  Elevated blood pressure reading     Discharge Instructions      Use albuterol every 4-6 hours as needed for shortness of breath and  coughing fits.  Start prednisone taper as prescribed.  Avoid NSAIDs including aspirin, ibuprofen/Advil, naproxen/Aleve with this medication as it can cause stomach bleeding.  Start Augmentin twice daily to cover for infection.  Use Mucinex, Flonase, Tylenol for additional symptom relief.  Make sure you rest and drink plenty of fluid.  If your symptoms or not improving within a few days please return for reevaluation.  If you have any worsening symptoms including high fever, severe cough, shortness of breath, chest pain, nausea/vomiting you need to be seen in the emergency room.     ED Prescriptions     Medication Sig Dispense Auth. Provider   albuterol (VENTOLIN HFA) 108 (90 Base) MCG/ACT inhaler Inhale 1-2 puffs into the lungs every 6 (six) hours as needed for wheezing or shortness of breath. 8 g Daziyah Cogan K, PA-C   predniSONE (STERAPRED UNI-PAK 21 TAB) 10 MG (21) TBPK tablet As directed 21 tablet Celesta Funderburk K, PA-C   amoxicillin-clavulanate (AUGMENTIN) 875-125 MG tablet Take 1 tablet by mouth every 12 (twelve) hours. 14 tablet Branda Chaudhary, Derry Skill, PA-C      PDMP not reviewed this encounter.   Terrilee Croak, PA-C 04/13/21 1830

## 2021-04-13 NOTE — ED Triage Notes (Signed)
Pt reports testing positive for COVID on the 20th.   States her cough has not improved and states sometimes she is unable to stop coughing. States she has taken Robitussin,c/o it causing her BP to go up.

## 2021-04-13 NOTE — Discharge Instructions (Signed)
Use albuterol every 4-6 hours as needed for shortness of breath and coughing fits.  Start prednisone taper as prescribed.  Avoid NSAIDs including aspirin, ibuprofen/Advil, naproxen/Aleve with this medication as it can cause stomach bleeding.  Start Augmentin twice daily to cover for infection.  Use Mucinex, Flonase, Tylenol for additional symptom relief.  Make sure you rest and drink plenty of fluid.  If your symptoms or not improving within a few days please return for reevaluation.  If you have any worsening symptoms including high fever, severe cough, shortness of breath, chest pain, nausea/vomiting you need to be seen in the emergency room.

## 2021-06-13 ENCOUNTER — Other Ambulatory Visit: Payer: Self-pay | Admitting: Family Medicine

## 2021-06-13 DIAGNOSIS — Z1231 Encounter for screening mammogram for malignant neoplasm of breast: Secondary | ICD-10-CM

## 2021-06-21 ENCOUNTER — Other Ambulatory Visit: Payer: Self-pay

## 2021-06-21 ENCOUNTER — Ambulatory Visit
Admission: RE | Admit: 2021-06-21 | Discharge: 2021-06-21 | Disposition: A | Discharge status: Home / Self Care | Payer: BC Managed Care – PPO | Source: Ambulatory Visit | Attending: Family Medicine | Admitting: Family Medicine

## 2021-06-21 DIAGNOSIS — Z1231 Encounter for screening mammogram for malignant neoplasm of breast: Secondary | ICD-10-CM

## 2021-08-29 ENCOUNTER — Ambulatory Visit: Payer: BC Managed Care – PPO | Admitting: Nurse Practitioner

## 2021-09-12 ENCOUNTER — Ambulatory Visit: Payer: Self-pay | Admitting: Student

## 2021-09-29 NOTE — Patient Instructions (Signed)
DUE TO COVID-19 ONLY TWO VISITORS  (aged 53 and older)  ARE ALLOWED TO COME WITH YOU AND STAY IN THE WAITING ROOM ONLY DURING PRE OP AND PROCEDURE.   **NO VISITORS ARE ALLOWED IN THE SHORT STAY AREA OR RECOVERY ROOM!!**  IF YOU WILL BE ADMITTED INTO THE HOSPITAL YOU ARE ALLOWED ONLY FOUR SUPPORT PEOPLE DURING VISITATION HOURS ONLY (7 AM -8PM)   The support person(s) must pass our screening, gel in and out, and wear a mask at all times, including in the patient's room. Patients must also wear a mask when staff or their support person are in the room. Visitors GUEST BADGE MUST BE WORN VISIBLY  One adult visitor may remain with you overnight and MUST be in the room by 8 P.M.     Your procedure is scheduled on: 10/05/21   Report to [facility] Main Entrance    Report to admitting at   5:15 AM   Call this number if you have problems the morning of surgery [phone]   Do not eat food :After Midnight.   After Midnight you may have the following liquids until _4:30_AM/  DAY OF SURGERY  Water Black Coffee (sugar ok, NO MILK/CREAM OR CREAMERS)  Tea (sugar ok, NO MILK/CREAM OR CREAMERS) regular and decaf                             Plain Jell-O (NO RED)                                           Fruit ices (not with fruit pulp, NO RED)                                     Popsicles (NO RED)                                                                  Juice: apple, WHITE grape, WHITE cranberry Sports drinks like Gatorade (NO RED) Clear broth(vegetable,chicken,beef)                    The day of surgery:  Drink ONE (1) Pre-Surgery Clear Ensure  at  4:15 AM the morning of surgery. Drink in one sitting. Do not sip.  This drink was given to you during your hospital  pre-op appointment visit. Nothing else to drink after completing the  Pre-Surgery Clear Ensure at 4:30 am          If you have questions, please contact your surgeon's office.       Oral Hygiene is also  important to reduce your risk of infection.                                    Remember - BRUSH YOUR TEETH THE MORNING OF SURGERY WITH YOUR REGULAR TOOTHPASTE   Do NOT smoke after Midnight   Take these medicines the morning of surgery with A SIP OF  WATER: Amlodipine                                You may not have any metal on your body including hair pins, jewelry, and body piercing             Do not wear make-up, lotions, powders, perfumes/cologne, or deodorant  Do not wear nail polish including gel and S&S, artificial/acrylic nails, or any other type of covering on natural nails including finger and toenails. If you have artificial nails, gel coating, etc. that needs to be removed by a nail salon please have this removed prior to surgery or surgery may need to be canceled/ delayed if the surgeon/ anesthesia feels like they are unable to be safely monitored.   Do not shave  48 hours prior to surgery.     Do not bring valuables to the hospital. [facility] IS NOT             RESPONSIBLE   FOR VALUABLES.   Contacts, dentures or bridgework may not be worn into surgery. .      Patients discharged on the day of surgery will not be allowed to drive home.  Someone NEEDS to stay with you for the first 24 hours after anesthesia.   Special Instructions: Bring a copy of your healthcare power of attorney and living will documents  the day of surgery if you haven't scanned them before.              Please read over the following fact sheets you were given: IF YOU HAVE QUESTIONS ABOUT YOUR PRE-OP INSTRUCTIONS PLEASE CALL [phone]     [facility] - Preparing for Surgery Before surgery, you can play an important role.  Because skin is not sterile, your skin needs to be as free of germs as possible.  You can reduce the number of germs on your skin by washing with CHG (chlorahexidine gluconate) soap before surgery.  CHG is an antiseptic cleaner which kills germs and bonds with the skin to  continue killing germs even after washing. Please DO NOT use if you have an allergy to CHG or antibacterial soaps.  If your skin becomes reddened/irritated stop using the CHG and inform your nurse when you arrive at Short Stay. Do not shave (including legs and underarms) for at least 48 hours prior to the first CHG shower.  Please follow these instructions carefully:  1.  Shower with CHG Soap the night before surgery and the  morning of Surgery.  2.  If you choose to wash your hair, wash your hair first as usual with your  normal  shampoo.  3.  After you shampoo, rinse your hair and body thoroughly to remove the  shampoo.                            4.  Use CHG as you would any other liquid soap.  You can apply chg directly  to the skin and wash                       Gently with a scrungie or clean washcloth.  5.  Apply the CHG Soap to your body ONLY FROM THE NECK DOWN.   Do not use on face/ open  Wound or open sores. Avoid contact with eyes, ears mouth and genitals (private parts).                       Wash face,  Genitals (private parts) with your normal soap.             6.  Wash thoroughly, paying special attention to the area where your surgery  will be performed.  7.  Thoroughly rinse your body with warm water from the neck down.  8.  DO NOT shower/wash with your normal soap after using and rinsing off  the CHG Soap.                9.  Pat yourself dry with a clean towel.            10.  Wear clean pajamas.            11.  Place clean sheets on your bed the night of your first shower and do not  sleep with pets. Day of Surgery : Do not apply any lotions/deodorants the morning of surgery.  Please wear clean clothes to the hospital/surgery center.  FAILURE TO FOLLOW THESE INSTRUCTIONS MAY RESULT IN THE CANCELLATION OF YOUR SURGERY PATIENT SIGNATURE_________________________________  NURSE  SIGNATURE__________________________________  ________________________________________________________________________   Adam Phenix  An incentive spirometer is a tool that can help keep your lungs clear and active. This tool measures how well you are filling your lungs with each breath. Taking long deep breaths may help reverse or decrease the chance of developing breathing (pulmonary) problems (especially infection) following: A long period of time when you are unable to move or be active. BEFORE THE PROCEDURE  If the spirometer includes an indicator to show your best effort, your nurse or respiratory therapist will set it to a desired goal. If possible, sit up straight or lean slightly forward. Try not to slouch. Hold the incentive spirometer in an upright position. INSTRUCTIONS FOR USE  Sit on the edge of your bed if possible, or sit up as far as you can in bed or on a chair. Hold the incentive spirometer in an upright position. Breathe out normally. Place the mouthpiece in your mouth and seal your lips tightly around it. Breathe in slowly and as deeply as possible, raising the piston or the ball toward the top of the column. Hold your breath for 3-5 seconds or for as long as possible. Allow the piston or ball to fall to the bottom of the column. Remove the mouthpiece from your mouth and breathe out normally. Rest for a few seconds and repeat Steps 1 through 7 at least 10 times every 1-2 hours when you are awake. Take your time and take a few normal breaths between deep breaths. The spirometer may include an indicator to show your best effort. Use the indicator as a goal to work toward during each repetition. After each set of 10 deep breaths, practice coughing to be sure your lungs are clear. If you have an incision (the cut made at the time of surgery), support your incision when coughing by placing a pillow or rolled up towels firmly against it. Once you are able to get out of  bed, walk around indoors and cough well. You may stop using the incentive spirometer when instructed by your caregiver.  RISKS AND COMPLICATIONS Take your time so you do not get dizzy or light-headed. If you are in pain, you may need to take or  ask for pain medication before doing incentive spirometry. It is harder to take a deep breath if you are having pain. AFTER USE Rest and breathe slowly and easily. It can be helpful to keep track of a log of your progress. Your caregiver can provide you with a simple table to help with this. If you are using the spirometer at home, follow these instructions: [place] IF:  You are having difficultly using the spirometer. You have trouble using the spirometer as often as instructed. Your pain medication is not giving enough relief while using the spirometer. You develop fever of 100.5 F (38.1 C) or higher. SEEK IMMEDIATE MEDICAL CARE IF:  You cough up bloody sputum that had not been present before. You develop fever of 102 F (38.9 C) or greater. You develop worsening pain at or near the incision site. MAKE SURE YOU:  Understand these instructions. Will watch your condition. Will get help right away if you are not doing well or get worse. Document Released: 08/07/2006 Document Revised: 06/19/2011 Document Reviewed: 10/08/2006 [facility] Patient Information 2014 [place], [place].   ________________________________________________________________________

## 2021-09-30 ENCOUNTER — Encounter (HOSPITAL_COMMUNITY)
Admission: RE | Admit: 2021-09-30 | Discharge: 2021-09-30 | Disposition: A | Discharge status: Home / Self Care | Payer: BC Managed Care – PPO | Source: Ambulatory Visit | Attending: Orthopedic Surgery | Admitting: Orthopedic Surgery

## 2021-09-30 ENCOUNTER — Other Ambulatory Visit: Payer: Self-pay

## 2021-09-30 ENCOUNTER — Encounter (HOSPITAL_COMMUNITY): Payer: Self-pay

## 2021-09-30 DIAGNOSIS — I10 Essential (primary) hypertension: Secondary | ICD-10-CM | POA: Diagnosis not present

## 2021-09-30 DIAGNOSIS — Z01818 Encounter for other preprocedural examination: Secondary | ICD-10-CM | POA: Diagnosis present

## 2021-09-30 LAB — CBC
HCT: 41.6 % (ref 36.0–46.0)
Hemoglobin: 13.8 g/dL (ref 12.0–15.0)
MCH: 26.6 pg (ref 26.0–34.0)
MCHC: 33.2 g/dL (ref 30.0–36.0)
MCV: 80.2 fL (ref 80.0–100.0)
Platelets: 289 10*3/uL (ref 150–400)
RBC: 5.19 MIL/uL — ABNORMAL HIGH (ref 3.87–5.11)
RDW: 14.9 % (ref 11.5–15.5)
WBC: 4.4 10*3/uL (ref 4.0–10.5)
nRBC: 0 % (ref 0.0–0.2)

## 2021-09-30 LAB — BASIC METABOLIC PANEL
Anion gap: 9 (ref 5–15)
BUN: 18 mg/dL (ref 6–20)
CO2: 27 mmol/L (ref 22–32)
Calcium: 9.8 mg/dL (ref 8.9–10.3)
Chloride: 104 mmol/L (ref 98–111)
Creatinine, Ser: 0.83 mg/dL (ref 0.44–1.00)
GFR, Estimated: >60 mL/min (ref >60)
Glucose, Bld: 93 mg/dL (ref 70–99)
Potassium: 3.4 mmol/L — ABNORMAL LOW (ref 3.5–5.1)
Sodium: 140 mmol/L (ref 135–145)

## 2021-09-30 LAB — SURGICAL PCR SCREEN
MRSA, PCR: NEGATIVE
Staphylococcus aureus: NEGATIVE

## 2021-09-30 NOTE — Progress Notes (Addendum)
Anesthesia note:  Bowel prep reminder:NA  PCP - Dr. Lowella Bandy Cardiologist -none Other-   Chest x-ray - no EKG - Pt refused the EKG because she thought she had one done in April with Dr. Parke Simmers. No record of EKG with Dr. Parke Simmers or with the hospital was found. Stress Test - no ECHO - no Cardiac Cath - NA  Pacemaker/ICD device last checked:NA  Sleep Study - NA CPAP -   Pt is pre diabetic-NA Fasting Blood Sugar -  Checks Blood Sugar _____  Blood Thinner:NA Blood Thinner Instructions: Aspirin Instructions: Last Dose:  Anesthesia review: no  Patient denies shortness of breath, fever, cough and chest pain at PAT appointment Pt has no SOB with activities  Patient verbalized understanding of instructions that were given to them at the PAT appointment. Patient was also instructed that they will need to review over the PAT instructions again at home before surgery. yes

## 2021-10-03 ENCOUNTER — Ambulatory Visit: Payer: Self-pay | Admitting: Student

## 2021-10-03 NOTE — H&P (Signed)
TOTAL HIP ADMISSION H&P  Patient is admitted for left total hip arthroplasty.  Subjective:  Chief Complaint: left hip pain  HPI: Kathy Harrison, 51 y.o. female, has a history of pain and functional disability in the left hip(s) due to arthritis and patient has failed non-surgical conservative treatments for greater than 12 weeks to include NSAID's and/or analgesics, flexibility and strengthening excercises, use of assistive devices, and activity modification.  Onset of symptoms was abrupt starting 1 years ago with rapidlly worsening course since that time.The patient noted no past surgery on the left hip(s).  Patient currently rates pain in the left hip at 10 out of 10 with activity. Patient has night pain, worsening of pain with activity and weight bearing, trendelenberg gait, pain that interfers with activities of daily living, and pain with passive range of motion. Patient has evidence of subchondral osseous remodeling, large effusion, loose bodies, superolateral femoral head migration, and full-thickness bipolar cartilage loss with bone-on-bone contact. No evidence of AVN by imaging studies. This condition presents safety issues increasing the risk of falls. There is no current active infection.  Patient Active Problem List   Diagnosis Date Noted   Hypertension 08/21/2015   BMI 39.0-39.9,adult 08/21/2015   DYSPNEA 05/03/2007   Past Medical History:  Diagnosis Date   Abnormal Pap smear of cervix    neg HPV HR 16,18/45   Hypertension    PONV (postoperative nausea and vomiting)     Past Surgical History:  Procedure Laterality Date   CESAREAN SECTION  2009   REDUCTION MAMMAPLASTY Bilateral 2003   WISDOM TOOTH EXTRACTION      Current Outpatient Medications  Medication Sig Dispense Refill Last Dose   acetaminophen (TYLENOL) 650 MG CR tablet Take 650-1,300 mg by mouth every 8 (eight) hours as needed for pain.      amLODipine (NORVASC) 5 MG tablet Take 5 mg by mouth at bedtime.       Chlorpheniramine-APAP (CORICIDIN) 2-325 MG TABS Take 1 tablet by mouth daily as needed (congestion).      valsartan-hydrochlorothiazide (DIOVAN-HCT) 160-25 MG tablet Take 1 tablet by mouth daily.      No current facility-administered medications for this visit.   Allergies  Allergen Reactions   Aspirin Nausea And Vomiting    If not coated     Social History   Tobacco Use   Smoking status: Never   Smokeless tobacco: Never  Substance Use Topics   Alcohol use: No    Family History  Problem Relation Age of Onset   Hypertension Mother    Stroke Mother    Hypertension Father    Other Sister 71       DEC MVA     Review of Systems  Musculoskeletal:  Positive for arthralgias, gait problem and myalgias.  All other systems reviewed and are negative.   Objective:  Physical Exam Constitutional:      Appearance: Normal appearance.  HENT:     Head: Normocephalic and atraumatic.     Nose: Nose normal.     Mouth/Throat:     Mouth: Mucous membranes are moist.     Pharynx: Oropharynx is clear.  Eyes:     Extraocular Movements: Extraocular movements intact.  Cardiovascular:     Rate and Rhythm: Normal rate and regular rhythm.     Pulses: Normal pulses.     Heart sounds: Normal heart sounds.  Pulmonary:     Effort: Pulmonary effort is normal.     Breath sounds: Normal breath sounds.  Abdominal:     General: Abdomen is flat.     Palpations: Abdomen is soft.  Genitourinary:    Comments: deferred Musculoskeletal:     Cervical back: Normal range of motion and neck supple.     Comments: Examination of the left hip reveals no skin wounds or lesions. No erythema. Mild trochanteric tenderness to palpation. She has severely restricted range of motion of the hip. There is a 20 degree flexion contracture, and I can flex her up to 80 degrees. She internally rotates 5 degrees, and externally rotates 10 degrees. Pain with terminal flexion and rotation. Pain in the position of impingement.    NVI distally.   She ambulates with an antalgic gait.  Skin:    General: Skin is warm and dry.     Capillary Refill: Capillary refill takes less than 2 seconds.  Neurological:     General: No focal deficit present.     Mental Status: She is alert and oriented to person, place, and time.  Psychiatric:        Mood and Affect: Mood normal.        Behavior: Behavior normal.        Thought Content: Thought content normal.        Judgment: Judgment normal.     Vital signs in last 24 hours: @VSRANGES@  Labs:   Estimated body mass index is 36.85 kg/m as calculated from the following:   Height as of 09/30/21: 5' 3" (1.6 m).   Weight as of 09/30/21: 94.3 kg.   Imaging Review Plain radiographs demonstrate severe degenerative joint disease of the left hip(s). The bone quality appears to be adequate for age and reported activity level.      Assessment/Plan:  End stage arthritis, left hip(s)  The patient history, physical examination, clinical judgement of the provider and imaging studies are consistent with end stage degenerative joint disease of the left hip(s) and total hip arthroplasty is deemed medically necessary. The treatment options including medical management, injection therapy, arthroscopy and arthroplasty were discussed at length. The risks and benefits of total hip arthroplasty were presented and reviewed. The risks due to aseptic loosening, infection, stiffness, dislocation/subluxation,  thromboembolic complications and other imponderables were discussed.  The patient acknowledged the explanation, agreed to proceed with the plan and consent was signed. Patient is being admitted for inpatient treatment for surgery, pain control, PT, OT, prophylactic antibiotics, VTE prophylaxis, progressive ambulation and ADL's and discharge planning.The patient is planning to be discharged home with HEP.    Therapy Plans: HEP.  Disposition: Home with mom Planned DVT Prophylaxis: aspirin  81mg BID DME needed: walker. PCP: Cleared. Need lab work from them.  TXA: Yes Allergies: NDKA Anesthesia Concerns: None BMI: 37.4 Last HgbA1c: 5.5 Other: - LABS. Going to call PCP and have them sent over.  - Hydrocodone, diclofenac, methocarbamol, zofran. Doesn't like narcotic medication.  - Difficulty with sleep due to pain.     Patient's anticipated LOS is less than 2 midnights, meeting these requirements: - Younger than 65 - Lives within 1 hour of care - Has a competent adult at home to recover with post-op recover - NO history of  - Chronic pain requiring opiods  - Diabetes  - Coronary Artery Disease  - Heart failure  - Heart attack  - Stroke  - DVT/VTE  - Cardiac arrhythmia  - Respiratory Failure/COPD  - Renal failure  - Anemia  - Advanced Liver disease   

## 2021-10-03 NOTE — H&P (View-Only) (Signed)
TOTAL HIP ADMISSION H&P  Patient is admitted for left total hip arthroplasty.  Subjective:  Chief Complaint: left hip pain  HPI: Kathy Harrison, 51 y.o. female, has a history of pain and functional disability in the left hip(s) due to arthritis and patient has failed non-surgical conservative treatments for greater than 12 weeks to include NSAID's and/or analgesics, flexibility and strengthening excercises, use of assistive devices, and activity modification.  Onset of symptoms was abrupt starting 1 years ago with rapidlly worsening course since that time.The patient noted no past surgery on the left hip(s).  Patient currently rates pain in the left hip at 10 out of 10 with activity. Patient has night pain, worsening of pain with activity and weight bearing, trendelenberg gait, pain that interfers with activities of daily living, and pain with passive range of motion. Patient has evidence of subchondral osseous remodeling, large effusion, loose bodies, superolateral femoral head migration, and full-thickness bipolar cartilage loss with bone-on-bone contact. No evidence of AVN by imaging studies. This condition presents safety issues increasing the risk of falls. There is no current active infection.  Patient Active Problem List   Diagnosis Date Noted   Hypertension 08/21/2015   BMI 39.0-39.9,adult 08/21/2015   DYSPNEA 05/03/2007   Past Medical History:  Diagnosis Date   Abnormal Pap smear of cervix    neg HPV HR 16,18/45   Hypertension    PONV (postoperative nausea and vomiting)     Past Surgical History:  Procedure Laterality Date   CESAREAN SECTION  2009   REDUCTION MAMMAPLASTY Bilateral 2003   WISDOM TOOTH EXTRACTION      Current Outpatient Medications  Medication Sig Dispense Refill Last Dose   acetaminophen (TYLENOL) 650 MG CR tablet Take 650-1,300 mg by mouth every 8 (eight) hours as needed for pain.      amLODipine (NORVASC) 5 MG tablet Take 5 mg by mouth at bedtime.       Chlorpheniramine-APAP (CORICIDIN) 2-325 MG TABS Take 1 tablet by mouth daily as needed (congestion).      valsartan-hydrochlorothiazide (DIOVAN-HCT) 160-25 MG tablet Take 1 tablet by mouth daily.      No current facility-administered medications for this visit.   Allergies  Allergen Reactions   Aspirin Nausea And Vomiting    If not coated     Social History   Tobacco Use   Smoking status: Never   Smokeless tobacco: Never  Substance Use Topics   Alcohol use: No    Family History  Problem Relation Age of Onset   Hypertension Mother    Stroke Mother    Hypertension Father    Other Sister 71       DEC MVA     Review of Systems  Musculoskeletal:  Positive for arthralgias, gait problem and myalgias.  All other systems reviewed and are negative.   Objective:  Physical Exam Constitutional:      Appearance: Normal appearance.  HENT:     Head: Normocephalic and atraumatic.     Nose: Nose normal.     Mouth/Throat:     Mouth: Mucous membranes are moist.     Pharynx: Oropharynx is clear.  Eyes:     Extraocular Movements: Extraocular movements intact.  Cardiovascular:     Rate and Rhythm: Normal rate and regular rhythm.     Pulses: Normal pulses.     Heart sounds: Normal heart sounds.  Pulmonary:     Effort: Pulmonary effort is normal.     Breath sounds: Normal breath sounds.  Abdominal:     General: Abdomen is flat.     Palpations: Abdomen is soft.  Genitourinary:    Comments: deferred Musculoskeletal:     Cervical back: Normal range of motion and neck supple.     Comments: Examination of the left hip reveals no skin wounds or lesions. No erythema. Mild trochanteric tenderness to palpation. She has severely restricted range of motion of the hip. There is a 20 degree flexion contracture, and I can flex her up to 80 degrees. She internally rotates 5 degrees, and externally rotates 10 degrees. Pain with terminal flexion and rotation. Pain in the position of impingement.    NVI distally.   She ambulates with an antalgic gait.  Skin:    General: Skin is warm and dry.     Capillary Refill: Capillary refill takes less than 2 seconds.  Neurological:     General: No focal deficit present.     Mental Status: She is alert and oriented to person, place, and time.  Psychiatric:        Mood and Affect: Mood normal.        Behavior: Behavior normal.        Thought Content: Thought content normal.        Judgment: Judgment normal.     Vital signs in last 24 hours: @VSRANGES @  Labs:   Estimated body mass index is 36.85 kg/m as calculated from the following:   Height as of 09/30/21: 5\' 3"  (1.6 m).   Weight as of 09/30/21: 94.3 kg.   Imaging Review Plain radiographs demonstrate severe degenerative joint disease of the left hip(s). The bone quality appears to be adequate for age and reported activity level.      Assessment/Plan:  End stage arthritis, left hip(s)  The patient history, physical examination, clinical judgement of the provider and imaging studies are consistent with end stage degenerative joint disease of the left hip(s) and total hip arthroplasty is deemed medically necessary. The treatment options including medical management, injection therapy, arthroscopy and arthroplasty were discussed at length. The risks and benefits of total hip arthroplasty were presented and reviewed. The risks due to aseptic loosening, infection, stiffness, dislocation/subluxation,  thromboembolic complications and other imponderables were discussed.  The patient acknowledged the explanation, agreed to proceed with the plan and consent was signed. Patient is being admitted for inpatient treatment for surgery, pain control, PT, OT, prophylactic antibiotics, VTE prophylaxis, progressive ambulation and ADL's and discharge planning.The patient is planning to be discharged home with HEP.    Therapy Plans: HEP.  Disposition: Home with mom Planned DVT Prophylaxis: aspirin  81mg  BID DME needed: walker. PCP: Cleared. Need lab work from them.  TXA: Yes Allergies: NDKA Anesthesia Concerns: None BMI: 37.4 Last HgbA1c: 5.5 Other: - LABS. Going to call PCP and have them sent over.  - Hydrocodone, diclofenac, methocarbamol, zofran. Doesn't like narcotic medication.  - Difficulty with sleep due to pain.     Patient's anticipated LOS is less than 2 midnights, meeting these requirements: - Younger than 53 - Lives within 1 hour of care - Has a competent adult at home to recover with post-op recover - NO history of  - Chronic pain requiring opiods  - Diabetes  - Coronary Artery Disease  - Heart failure  - Heart attack  - Stroke  - DVT/VTE  - Cardiac arrhythmia  - Respiratory Failure/COPD  - Renal failure  - Anemia  - Advanced Liver disease

## 2021-10-04 NOTE — Anesthesia Preprocedure Evaluation (Addendum)
Anesthesia Evaluation  Patient identified by MRN, date of birth, ID band Patient awake    Reviewed: Allergy & Precautions, NPO status , Patient's Chart, lab work & pertinent test results  History of Anesthesia Complications (+) PONVNegative for: history of anesthetic complications  Airway Mallampati: II  TM Distance: >3 FB Neck ROM: Full    Dental  (+) Dental Advisory Given, Missing   Pulmonary neg pulmonary ROS,    breath sounds clear to auscultation       Cardiovascular hypertension, Pt. on medications and Pt. on home beta blockers (-) angina Rhythm:Regular Rate:Normal     Neuro/Psych negative neurological ROS     GI/Hepatic negative GI ROS, Neg liver ROS,   Endo/Other  Morbid obesity  Renal/GU negative Renal ROS     Musculoskeletal   Abdominal (+) + obese,   Peds  Hematology negative hematology ROS (+)   Anesthesia Other Findings   Reproductive/Obstetrics                            Anesthesia Physical Anesthesia Plan  ASA: 3  Anesthesia Plan: Spinal   Post-op Pain Management: Tylenol PO (pre-op)*   Induction:   PONV Risk Score and Plan: 3 and Ondansetron and Treatment may vary due to age or medical condition  Airway Management Planned: Natural Airway and Simple Face Mask  Additional Equipment: None  Intra-op Plan:   Post-operative Plan:   Informed Consent: I have reviewed the patients History and Physical, chart, labs and discussed the procedure including the risks, benefits and alternatives for the proposed anesthesia with the patient or authorized representative who has indicated his/her understanding and acceptance.     Dental advisory given  Plan Discussed with: CRNA and Surgeon  Anesthesia Plan Comments:        Anesthesia Quick Evaluation

## 2021-10-05 ENCOUNTER — Ambulatory Visit (HOSPITAL_COMMUNITY): Payer: BC Managed Care – PPO

## 2021-10-05 ENCOUNTER — Other Ambulatory Visit: Payer: Self-pay

## 2021-10-05 ENCOUNTER — Observation Stay (HOSPITAL_COMMUNITY)
Admission: RE | Admit: 2021-10-05 | Discharge: 2021-10-06 | Disposition: A | Discharge status: Home / Self Care | Payer: BC Managed Care – PPO | Source: Ambulatory Visit | Attending: Orthopedic Surgery | Admitting: Orthopedic Surgery

## 2021-10-05 ENCOUNTER — Encounter (HOSPITAL_COMMUNITY): Payer: Self-pay | Admitting: Orthopedic Surgery

## 2021-10-05 ENCOUNTER — Encounter (HOSPITAL_COMMUNITY)
Admission: RE | Disposition: A | Discharge status: Home / Self Care | Payer: Self-pay | Source: Ambulatory Visit | Attending: Orthopedic Surgery

## 2021-10-05 ENCOUNTER — Ambulatory Visit (HOSPITAL_COMMUNITY): Payer: BC Managed Care – PPO | Admitting: Registered Nurse

## 2021-10-05 DIAGNOSIS — I10 Essential (primary) hypertension: Secondary | ICD-10-CM | POA: Insufficient documentation

## 2021-10-05 DIAGNOSIS — Z79899 Other long term (current) drug therapy: Secondary | ICD-10-CM | POA: Insufficient documentation

## 2021-10-05 DIAGNOSIS — M1612 Unilateral primary osteoarthritis, left hip: Principal | ICD-10-CM | POA: Insufficient documentation

## 2021-10-05 DIAGNOSIS — Z96642 Presence of left artificial hip joint: Secondary | ICD-10-CM

## 2021-10-05 DIAGNOSIS — Z96649 Presence of unspecified artificial hip joint: Secondary | ICD-10-CM

## 2021-10-05 DIAGNOSIS — Z01818 Encounter for other preprocedural examination: Secondary | ICD-10-CM

## 2021-10-05 HISTORY — PX: TOTAL HIP ARTHROPLASTY: SHX124

## 2021-10-05 HISTORY — DX: Presence of unspecified artificial hip joint: Z96.649

## 2021-10-05 LAB — TYPE AND SCREEN
ABO/RH(D): O POS
Antibody Screen: NEGATIVE

## 2021-10-05 SURGERY — ARTHROPLASTY, HIP, TOTAL, ANTERIOR APPROACH
Anesthesia: General | Site: Hip | Laterality: Left

## 2021-10-05 MED ORDER — MIDAZOLAM HCL 5 MG/5ML IJ SOLN
INTRAMUSCULAR | Status: DC | PRN
  Administered 2021-10-05: 2 mg via INTRAVENOUS

## 2021-10-05 MED ORDER — METHOCARBAMOL 500 MG IVPB - SIMPLE MED
500.0000 mg | Freq: Four times a day (QID) | INTRAVENOUS | Status: DC | PRN
  Filled 2021-10-05 (×2): qty 55

## 2021-10-05 MED ORDER — MEPERIDINE HCL 50 MG/ML IJ SOLN: 6.2500 mg | INTRAMUSCULAR | Status: DC | PRN

## 2021-10-05 MED ORDER — POVIDONE-IODINE 10 % EX SWAB: 2.0000 "application " | Freq: Once | CUTANEOUS | Status: DC

## 2021-10-05 MED ORDER — FENTANYL CITRATE (PF) 250 MCG/5ML IJ SOLN
INTRAMUSCULAR | Status: AC
  Filled 2021-10-05: qty 5

## 2021-10-05 MED ORDER — POVIDONE-IODINE 10 % EX SWAB
2.0000 | Freq: Once | CUTANEOUS | Status: AC
  Administered 2021-10-05: 2 via TOPICAL

## 2021-10-05 MED ORDER — DEXAMETHASONE SODIUM PHOSPHATE 10 MG/ML IJ SOLN
INTRAMUSCULAR | Status: AC
  Filled 2021-10-05: qty 1

## 2021-10-05 MED ORDER — ISOPROPYL ALCOHOL 70 % SOLN
Status: AC
  Filled 2021-10-05: qty 480

## 2021-10-05 MED ORDER — ASPIRIN 81 MG PO CHEW
81.0000 mg | CHEWABLE_TABLET | Freq: Two times a day (BID) | ORAL | Status: DC
  Administered 2021-10-05: 81 mg via ORAL
  Filled 2021-10-05: qty 1

## 2021-10-05 MED ORDER — METOCLOPRAMIDE HCL 5 MG/ML IJ SOLN: 5.0000 mg | Freq: Three times a day (TID) | INTRAMUSCULAR | Status: DC | PRN

## 2021-10-05 MED ORDER — KETOROLAC TROMETHAMINE 30 MG/ML IJ SOLN
INTRAMUSCULAR | Status: AC
  Filled 2021-10-05: qty 1

## 2021-10-05 MED ORDER — AMISULPRIDE (ANTIEMETIC) 5 MG/2ML IV SOLN
INTRAVENOUS | Status: AC
  Filled 2021-10-05: qty 4

## 2021-10-05 MED ORDER — ONDANSETRON HCL 4 MG PO TABS: 4.0000 mg | ORAL_TABLET | Freq: Three times a day (TID) | ORAL | 0 refills | Status: DC | PRN

## 2021-10-05 MED ORDER — POLYETHYLENE GLYCOL 3350 17 G PO PACK: 17.0000 g | PACK | Freq: Every day | ORAL | 0 refills | Status: AC | PRN

## 2021-10-05 MED ORDER — WATER FOR IRRIGATION, STERILE IR SOLN
Status: DC | PRN
  Administered 2021-10-05: 2000 mL

## 2021-10-05 MED ORDER — ASPIRIN 81 MG PO CHEW: 81.0000 mg | CHEWABLE_TABLET | Freq: Two times a day (BID) | ORAL | 0 refills | Status: DC

## 2021-10-05 MED ORDER — MIDAZOLAM HCL 2 MG/2ML IJ SOLN: 0.5000 mg | Freq: Once | INTRAMUSCULAR | Status: DC | PRN

## 2021-10-05 MED ORDER — LACTATED RINGERS IV SOLN: INTRAVENOUS | Status: DC

## 2021-10-05 MED ORDER — METHOCARBAMOL 500 MG PO TABS: 500.0000 mg | ORAL_TABLET | Freq: Four times a day (QID) | ORAL | 0 refills | Status: DC | PRN

## 2021-10-05 MED ORDER — CEFAZOLIN SODIUM-DEXTROSE 2-4 GM/100ML-% IV SOLN
2.0000 g | Freq: Four times a day (QID) | INTRAVENOUS | Status: AC
  Administered 2021-10-05 (×2): 2 g via INTRAVENOUS
  Filled 2021-10-05 (×2): qty 100

## 2021-10-05 MED ORDER — MENTHOL 3 MG MT LOZG
1.0000 | LOZENGE | OROMUCOSAL | Status: DC | PRN
Start: 2021-10-05 — End: 2021-10-06

## 2021-10-05 MED ORDER — PHENYLEPHRINE 80 MCG/ML (10ML) SYRINGE FOR IV PUSH (FOR BLOOD PRESSURE SUPPORT)
PREFILLED_SYRINGE | INTRAVENOUS | Status: AC
  Filled 2021-10-05: qty 10

## 2021-10-05 MED ORDER — CHLORHEXIDINE GLUCONATE 0.12 % MT SOLN
15.0000 mL | Freq: Once | OROMUCOSAL | Status: AC
  Administered 2021-10-05: 15 mL via OROMUCOSAL

## 2021-10-05 MED ORDER — HYDROMORPHONE HCL 1 MG/ML IJ SOLN: 0.2500 mg | INTRAMUSCULAR | Status: DC | PRN

## 2021-10-05 MED ORDER — HYDROMORPHONE HCL 1 MG/ML IJ SOLN
INTRAMUSCULAR | Status: DC | PRN
  Administered 2021-10-05 (×2): 1 mg via INTRAVENOUS

## 2021-10-05 MED ORDER — BUPIVACAINE-EPINEPHRINE 0.25% -1:200000 IJ SOLN
INTRAMUSCULAR | Status: DC | PRN
  Administered 2021-10-05: 30 mL

## 2021-10-05 MED ORDER — KETOROLAC TROMETHAMINE 30 MG/ML IJ SOLN
INTRAMUSCULAR | Status: DC | PRN
  Administered 2021-10-05: 30 mg

## 2021-10-05 MED ORDER — LACTATED RINGERS IV BOLUS: 250.0000 mL | Freq: Once | INTRAVENOUS | Status: DC

## 2021-10-05 MED ORDER — ACETAMINOPHEN 500 MG PO TABS
1000.0000 mg | ORAL_TABLET | Freq: Once | ORAL | Status: AC
Start: 2021-10-05 — End: 2021-10-05
  Administered 2021-10-05: 1000 mg via ORAL

## 2021-10-05 MED ORDER — SENNA 8.6 MG PO TABS
1.0000 | ORAL_TABLET | Freq: Two times a day (BID) | ORAL | Status: DC
  Administered 2021-10-05 – 2021-10-06 (×2): 8.6 mg via ORAL
  Filled 2021-10-05 (×2): qty 1

## 2021-10-05 MED ORDER — ONDANSETRON HCL 4 MG/2ML IJ SOLN
INTRAMUSCULAR | Status: DC | PRN
  Administered 2021-10-05: 4 mg via INTRAVENOUS

## 2021-10-05 MED ORDER — POLYETHYLENE GLYCOL 3350 17 G PO PACK: 17.0000 g | PACK | Freq: Every day | ORAL | Status: DC | PRN

## 2021-10-05 MED ORDER — PROPOFOL 10 MG/ML IV BOLUS
INTRAVENOUS | Status: DC | PRN
  Administered 2021-10-05: 140 mg via INTRAVENOUS

## 2021-10-05 MED ORDER — HYDROMORPHONE HCL 2 MG/ML IJ SOLN
INTRAMUSCULAR | Status: AC
  Filled 2021-10-05: qty 1

## 2021-10-05 MED ORDER — OXYCODONE HCL 5 MG PO TABS: 5.0000 mg | ORAL_TABLET | Freq: Once | ORAL | Status: DC | PRN

## 2021-10-05 MED ORDER — APIXABAN 2.5 MG PO TABS: 2.5000 mg | ORAL_TABLET | Freq: Two times a day (BID) | ORAL | 1 refills | Status: DC

## 2021-10-05 MED ORDER — ISOPROPYL ALCOHOL 70 % SOLN
Status: DC | PRN
  Administered 2021-10-05: 1 via TOPICAL

## 2021-10-05 MED ORDER — ACETAMINOPHEN 500 MG PO TABS
1000.0000 mg | ORAL_TABLET | Freq: Once | ORAL | Status: DC
  Filled 2021-10-05: qty 2

## 2021-10-05 MED ORDER — CEFAZOLIN SODIUM-DEXTROSE 2-4 GM/100ML-% IV SOLN
2.0000 g | INTRAVENOUS | Status: AC
  Administered 2021-10-05: 2 g via INTRAVENOUS
  Filled 2021-10-05: qty 100

## 2021-10-05 MED ORDER — SODIUM CHLORIDE (PF) 0.9 % IJ SOLN
INTRAMUSCULAR | Status: DC | PRN
  Administered 2021-10-05: 30 mL

## 2021-10-05 MED ORDER — TRANEXAMIC ACID-NACL 1000-0.7 MG/100ML-% IV SOLN
1000.0000 mg | INTRAVENOUS | Status: AC
  Administered 2021-10-05: 1000 mg via INTRAVENOUS
  Filled 2021-10-05: qty 100

## 2021-10-05 MED ORDER — PROPOFOL 10 MG/ML IV BOLUS
INTRAVENOUS | Status: AC
  Filled 2021-10-05: qty 20

## 2021-10-05 MED ORDER — ORAL CARE MOUTH RINSE: 15.0000 mL | Freq: Once | OROMUCOSAL | Status: AC

## 2021-10-05 MED ORDER — ROCURONIUM BROMIDE 100 MG/10ML IV SOLN
INTRAVENOUS | Status: DC | PRN
  Administered 2021-10-05: 60 mg via INTRAVENOUS

## 2021-10-05 MED ORDER — LIDOCAINE HCL (PF) 2 % IJ SOLN
INTRAMUSCULAR | Status: AC
  Filled 2021-10-05: qty 5

## 2021-10-05 MED ORDER — LACTATED RINGERS IV BOLUS
500.0000 mL | Freq: Once | INTRAVENOUS | Status: AC
  Administered 2021-10-05: 500 mL via INTRAVENOUS

## 2021-10-05 MED ORDER — HYDROCODONE-ACETAMINOPHEN 7.5-325 MG PO TABS: 1.0000 | ORAL_TABLET | ORAL | Status: DC | PRN

## 2021-10-05 MED ORDER — SODIUM CHLORIDE (PF) 0.9 % IJ SOLN
INTRAMUSCULAR | Status: AC
  Filled 2021-10-05: qty 50

## 2021-10-05 MED ORDER — DEXAMETHASONE SODIUM PHOSPHATE 10 MG/ML IJ SOLN
INTRAMUSCULAR | Status: DC | PRN
  Administered 2021-10-05: 10 mg via INTRAVENOUS

## 2021-10-05 MED ORDER — LABETALOL HCL 5 MG/ML IV SOLN
INTRAVENOUS | Status: AC
Start: 2021-10-05 — End: ?
  Filled 2021-10-05: qty 4

## 2021-10-05 MED ORDER — PROMETHAZINE HCL 25 MG/ML IJ SOLN: 6.2500 mg | INTRAMUSCULAR | Status: DC | PRN

## 2021-10-05 MED ORDER — HYDROCODONE-ACETAMINOPHEN 10-325 MG PO TABS
0.5000 | ORAL_TABLET | ORAL | 0 refills | Status: AC | PRN
Start: 2021-10-05 — End: 2021-10-12

## 2021-10-05 MED ORDER — SUGAMMADEX SODIUM 200 MG/2ML IV SOLN
INTRAVENOUS | Status: DC | PRN
  Administered 2021-10-05: 200 mg via INTRAVENOUS

## 2021-10-05 MED ORDER — SENNA 8.6 MG PO TABS: 2.0000 | ORAL_TABLET | Freq: Every day | ORAL | 0 refills | Status: DC

## 2021-10-05 MED ORDER — ROCURONIUM BROMIDE 10 MG/ML (PF) SYRINGE
PREFILLED_SYRINGE | INTRAVENOUS | Status: AC
  Filled 2021-10-05: qty 10

## 2021-10-05 MED ORDER — LIDOCAINE HCL (CARDIAC) PF 100 MG/5ML IV SOSY
PREFILLED_SYRINGE | INTRAVENOUS | Status: DC | PRN
  Administered 2021-10-05: 40 mg via INTRAVENOUS

## 2021-10-05 MED ORDER — FENTANYL CITRATE (PF) 100 MCG/2ML IJ SOLN
INTRAMUSCULAR | Status: DC | PRN
  Administered 2021-10-05 (×2): 50 ug via INTRAVENOUS
  Administered 2021-10-05 (×2): 100 ug via INTRAVENOUS
  Administered 2021-10-05: 50 ug via INTRAVENOUS

## 2021-10-05 MED ORDER — FENTANYL CITRATE (PF) 100 MCG/2ML IJ SOLN
INTRAMUSCULAR | Status: AC
  Filled 2021-10-05: qty 2

## 2021-10-05 MED ORDER — BUPIVACAINE-EPINEPHRINE 0.25% -1:200000 IJ SOLN
INTRAMUSCULAR | Status: AC
  Filled 2021-10-05: qty 1

## 2021-10-05 MED ORDER — MIDAZOLAM HCL 2 MG/2ML IJ SOLN
INTRAMUSCULAR | Status: AC
  Filled 2021-10-05: qty 2

## 2021-10-05 MED ORDER — DIPHENHYDRAMINE HCL 12.5 MG/5ML PO ELIX
12.5000 mg | ORAL_SOLUTION | ORAL | Status: DC | PRN
  Administered 2021-10-05: 25 mg via ORAL
  Filled 2021-10-05: qty 10

## 2021-10-05 MED ORDER — MORPHINE SULFATE (PF) 2 MG/ML IV SOLN
0.5000 mg | INTRAVENOUS | Status: DC | PRN
  Administered 2021-10-05: 1 mg via INTRAVENOUS
  Filled 2021-10-05: qty 1

## 2021-10-05 MED ORDER — SODIUM CHLORIDE 0.9 % IV SOLN
INTRAVENOUS | Status: DC
Start: 2021-10-05 — End: 2021-10-06

## 2021-10-05 MED ORDER — METOCLOPRAMIDE HCL 5 MG PO TABS: 5.0000 mg | ORAL_TABLET | Freq: Three times a day (TID) | ORAL | Status: DC | PRN

## 2021-10-05 MED ORDER — ONDANSETRON HCL 4 MG PO TABS
4.0000 mg | ORAL_TABLET | Freq: Four times a day (QID) | ORAL | Status: DC | PRN
  Administered 2021-10-06: 4 mg via ORAL
  Filled 2021-10-05 (×2): qty 1

## 2021-10-05 MED ORDER — PANTOPRAZOLE SODIUM 40 MG PO TBEC
40.0000 mg | DELAYED_RELEASE_TABLET | Freq: Every day | ORAL | Status: DC
  Administered 2021-10-05 – 2021-10-06 (×2): 40 mg via ORAL
  Filled 2021-10-05 (×2): qty 1

## 2021-10-05 MED ORDER — HYDROCODONE-ACETAMINOPHEN 5-325 MG PO TABS
1.0000 | ORAL_TABLET | ORAL | Status: DC | PRN
  Administered 2021-10-06 (×2): 2 via ORAL
  Filled 2021-10-05 (×3): qty 2

## 2021-10-05 MED ORDER — METHOCARBAMOL 500 MG PO TABS
500.0000 mg | ORAL_TABLET | Freq: Four times a day (QID) | ORAL | Status: DC | PRN
  Administered 2021-10-05 – 2021-10-06 (×2): 500 mg via ORAL
  Filled 2021-10-05 (×2): qty 1

## 2021-10-05 MED ORDER — OXYCODONE HCL 5 MG/5ML PO SOLN: 5.0000 mg | Freq: Once | ORAL | Status: DC | PRN

## 2021-10-05 MED ORDER — SODIUM CHLORIDE 0.9 % IR SOLN
Status: DC | PRN
  Administered 2021-10-05: 1000 mL

## 2021-10-05 MED ORDER — ACETAMINOPHEN 325 MG PO TABS: 325.0000 mg | ORAL_TABLET | Freq: Four times a day (QID) | ORAL | Status: DC | PRN

## 2021-10-05 MED ORDER — BISACODYL 10 MG RE SUPP: 10.0000 mg | Freq: Every day | RECTAL | Status: DC | PRN

## 2021-10-05 MED ORDER — ONDANSETRON HCL 4 MG/2ML IJ SOLN
INTRAMUSCULAR | Status: AC
  Filled 2021-10-05: qty 2

## 2021-10-05 MED ORDER — PHENOL 1.4 % MT LIQD: 1.0000 | OROMUCOSAL | Status: DC | PRN

## 2021-10-05 MED ORDER — ALUM & MAG HYDROXIDE-SIMETH 200-200-20 MG/5ML PO SUSP: 30.0000 mL | ORAL | Status: DC | PRN

## 2021-10-05 MED ORDER — PROPOFOL 1000 MG/100ML IV EMUL
INTRAVENOUS | Status: AC
  Filled 2021-10-05: qty 100

## 2021-10-05 MED ORDER — LABETALOL HCL 5 MG/ML IV SOLN
INTRAVENOUS | Status: DC | PRN
  Administered 2021-10-05: 10 mg via INTRAVENOUS

## 2021-10-05 MED ORDER — ACETAMINOPHEN 500 MG PO TABS
500.0000 mg | ORAL_TABLET | Freq: Four times a day (QID) | ORAL | Status: DC
  Administered 2021-10-05: 500 mg via ORAL
  Filled 2021-10-05: qty 1

## 2021-10-05 MED ORDER — ONDANSETRON HCL 4 MG/2ML IJ SOLN
4.0000 mg | Freq: Four times a day (QID) | INTRAMUSCULAR | Status: DC | PRN
  Administered 2021-10-05: 4 mg via INTRAVENOUS
  Filled 2021-10-05: qty 2

## 2021-10-05 MED ORDER — AMISULPRIDE (ANTIEMETIC) 5 MG/2ML IV SOLN
10.0000 mg | Freq: Once | INTRAVENOUS | Status: AC | PRN
  Administered 2021-10-05: 10 mg via INTRAVENOUS

## 2021-10-05 MED ORDER — DOCUSATE SODIUM 100 MG PO CAPS
100.0000 mg | ORAL_CAPSULE | Freq: Two times a day (BID) | ORAL | Status: DC
Start: 2021-10-05 — End: 2021-10-06
  Administered 2021-10-05 – 2021-10-06 (×2): 100 mg via ORAL
  Filled 2021-10-05 (×2): qty 1

## 2021-10-05 MED ORDER — DOCUSATE SODIUM 100 MG PO CAPS: 100.0000 mg | ORAL_CAPSULE | Freq: Two times a day (BID) | ORAL | 0 refills | Status: DC

## 2021-10-05 SURGICAL SUPPLY — 63 items
ADH SKN CLS APL DERMABOND .7 (GAUZE/BANDAGES/DRESSINGS) ×1
APL PRP STRL LF DISP 70% ISPRP (MISCELLANEOUS) ×1
BAG COUNTER SPONGE SURGICOUNT (BAG) ×1 IMPLANT
BAG DECANTER FOR FLEXI CONT (MISCELLANEOUS) IMPLANT
BAG SPEC THK2 15X12 ZIP CLS (MISCELLANEOUS)
BAG SPNG CNTER NS LX DISP (BAG) ×1
BAG ZIPLOCK 12X15 (MISCELLANEOUS) IMPLANT
CHLORAPREP W/TINT 26 (MISCELLANEOUS) ×2 IMPLANT
COVER PERINEAL POST (MISCELLANEOUS) ×2 IMPLANT
COVER SURGICAL LIGHT HANDLE (MISCELLANEOUS) ×2 IMPLANT
DERMABOND ADVANCED (GAUZE/BANDAGES/DRESSINGS) ×1
DERMABOND ADVANCED .7 DNX12 (GAUZE/BANDAGES/DRESSINGS) ×2 IMPLANT
DRAPE IMP U-DRAPE 54X76 (DRAPES) ×2 IMPLANT
DRAPE SHEET LG 3/4 BI-LAMINATE (DRAPES) ×6 IMPLANT
DRAPE STERI IOBAN 125X83 (DRAPES) ×2 IMPLANT
DRAPE U-SHAPE 47X51 STRL (DRAPES) ×4 IMPLANT
DRSG AQUACEL AG ADV 3.5X10 (GAUZE/BANDAGES/DRESSINGS) ×2 IMPLANT
ELECT REM PT RETURN 15FT ADLT (MISCELLANEOUS) ×2 IMPLANT
GAUZE SPONGE 4X4 12PLY STRL (GAUZE/BANDAGES/DRESSINGS) ×2 IMPLANT
GLOVE BIO SURGEON STRL SZ8.5 (GLOVE) ×4 IMPLANT
GLOVE BIOGEL M 7.0 STRL (GLOVE) ×2 IMPLANT
GLOVE BIOGEL PI IND STRL 7.5 (GLOVE) ×1 IMPLANT
GLOVE BIOGEL PI IND STRL 8.5 (GLOVE) ×1 IMPLANT
GLOVE BIOGEL PI INDICATOR 7.5 (GLOVE) ×1
GLOVE BIOGEL PI INDICATOR 8.5 (GLOVE) ×1
GLOVE SURG LX 7.5 STRW (GLOVE) ×2
GLOVE SURG LX STRL 7.5 STRW (GLOVE) ×2 IMPLANT
GOWN SPEC L3 XXLG W/TWL (GOWN DISPOSABLE) ×2 IMPLANT
GOWN STRL REUS W/ TWL XL LVL3 (GOWN DISPOSABLE) ×1 IMPLANT
GOWN STRL REUS W/TWL XL LVL3 (GOWN DISPOSABLE) ×2
HANDPIECE INTERPULSE COAX TIP (DISPOSABLE) ×2
HEAD FEM 32X0 DELTA TI (Head) ×1 IMPLANT
HOLDER FOLEY CATH W/STRAP (MISCELLANEOUS) ×1 IMPLANT
HOOD PEEL AWAY FLYTE STAYCOOL (MISCELLANEOUS) ×6 IMPLANT
KIT TURNOVER KIT A (KITS) ×1 IMPLANT
LINER G7 VIT E NTRL 32 SZD HIP (Liner) ×1 IMPLANT
MANIFOLD NEPTUNE II (INSTRUMENTS) ×2 IMPLANT
MARKER SKIN DUAL TIP RULER LAB (MISCELLANEOUS) ×2 IMPLANT
NDL SAFETY ECLIPSE 18X1.5 (NEEDLE) ×1 IMPLANT
NDL SPNL 18GX3.5 QUINCKE PK (NEEDLE) ×1 IMPLANT
NEEDLE HYPO 18GX1.5 SHARP (NEEDLE) ×2
NEEDLE SPNL 18GX3.5 QUINCKE PK (NEEDLE) ×2 IMPLANT
PACK ANTERIOR HIP CUSTOM (KITS) ×2 IMPLANT
PENCIL SMOKE EVACUATOR (MISCELLANEOUS) IMPLANT
SAW OSC TIP CART 19.5X105X1.3 (SAW) ×2 IMPLANT
SEALER BIPOLAR AQUA 6.0 (INSTRUMENTS) ×2 IMPLANT
SET HNDPC FAN SPRY TIP SCT (DISPOSABLE) ×1 IMPLANT
SHELL ACET G7 3H 50 SZD (Shell) ×1 IMPLANT
SOLUTION PRONTOSAN WOUND 350ML (IRRIGATION / IRRIGATOR) ×2 IMPLANT
SPIKE FLUID TRANSFER (MISCELLANEOUS) ×1 IMPLANT
STEM FEM CMTLS HIP 6 STD 97.5 (Stem) ×1 IMPLANT
SUT MNCRL AB 3-0 PS2 18 (SUTURE) ×2 IMPLANT
SUT MON AB 2-0 CT1 36 (SUTURE) ×2 IMPLANT
SUT STRATAFIX PDO 1 14 VIOLET (SUTURE) ×2
SUT STRATFX PDO 1 14 VIOLET (SUTURE) ×1
SUT VIC AB 2-0 CT1 27 (SUTURE)
SUT VIC AB 2-0 CT1 TAPERPNT 27 (SUTURE) IMPLANT
SUTURE STRATFX PDO 1 14 VIOLET (SUTURE) ×1 IMPLANT
SYR 3ML LL SCALE MARK (SYRINGE) ×2 IMPLANT
TRAY FOLEY MTR SLVR 14FR STAT (SET/KITS/TRAYS/PACK) IMPLANT
TRAY FOLEY MTR SLVR 16FR STAT (SET/KITS/TRAYS/PACK) IMPLANT
TUBE SUCTION HIGH CAP CLEAR NV (SUCTIONS) ×2 IMPLANT
WATER STERILE IRR 1000ML POUR (IV SOLUTION) ×2 IMPLANT

## 2021-10-05 NOTE — Evaluation (Signed)
Physical Therapy Evaluation Patient Details Name: Kathy Harrison MRN: [id number] DOB: 1970/12/22 Today's Date: 10/05/2021  History of Present Illness  Pt is a 51yo female presenting sp L-THA, AA on 10/05/21. PMH: HTN, PONV.  Clinical Impression  Kathy Harrison is a 51 y.o. female POD 0 s/p L-THA, AA. Patient reports independence with mobility at baseline. Patient is now limited by functional impairments (see PT problem list below) and requires min assist for bed mobility, min guard for transfers; pt able to complete step pivot transfer to/from [facility] but reporting nausea and shakiness so further mobility deferred, RN notified. Patient instructed in exercise to facilitate ROM and circulation to manage edema. Patient will benefit from continued skilled PT interventions to address impairments and progress towards PLOF. Acute PT will follow to progress mobility and stair training in preparation for safe discharge home.       Recommendations for follow up therapy are one component of a multi-disciplinary discharge planning process, led by the attending physician.  Recommendations may be updated based on patient status, additional functional criteria and insurance authorization.  Follow Up Recommendations Follow physician's recommendations for discharge plan and follow up therapies      Assistance Recommended at Discharge Intermittent Supervision/Assistance  Patient can return home with the following  A little help with walking and/or transfers;A little help with bathing/dressing/bathroom;Assistance with cooking/housework;Assist for transportation;Help with stairs or ramp for entrance    Equipment Recommendations Rolling walker (2 wheels)  Recommendations for Other Services       Functional Status Assessment Patient has had a recent decline in their functional status and demonstrates the ability to make significant improvements in function in a reasonable and predictable amount of time.     Precautions /  Restrictions Precautions Precautions: Fall Restrictions Weight Bearing Restrictions: No Other Position/Activity Restrictions: WBAT      Mobility  Bed Mobility Overal bed mobility: Needs Assistance Bed Mobility: Supine to Sit, Sit to Supine     Supine to sit: Min assist Sit to supine: Min assist   General bed mobility comments: Required min assist to bring LLE off and back on bed, pt able to scoot up in bed using BUE and RLE during repositioning.    Transfers Overall transfer level: Needs assistance Equipment used: Rolling walker (2 wheels) Transfers: Sit to/from Stand, Bed to chair/wheelchair/BSC Sit to Stand: Min guard   Step pivot transfers: Min guard       General transfer comment: Min guard for safety, no physical assist or overt LOB noted. VCs for hand placement, proximity to device, and using hands to lower down during stand to sit transfer to [facility].    Ambulation/Gait               General Gait Details: deferred, pt shaking and nauseated, RN notified.  Stairs            Wheelchair Mobility    Modified Rankin (Stroke Patients Only)       Balance Overall balance assessment: Needs assistance Sitting-balance support: Feet supported, No upper extremity supported Sitting balance-Leahy Scale: Good     Standing balance support: During functional activity, Bilateral upper extremity supported Standing balance-Leahy Scale: Fair Standing balance comment: Pt able to stand with only single UE support but required BUE support during step pivot transfer                             Pertinent Vitals/Pain Pain Assessment Pain Assessment: Faces  Faces Pain Scale: Hurts whole lot Pain Location: L hip Pain Descriptors / Indicators: Operative site guarding, Discomfort Pain Intervention(s): Limited activity within patient's tolerance, Monitored during session, Repositioned, Ice applied    Home Living Family/patient expects to be discharged to::  Private residence Living Arrangements: Children Available Help at Discharge: Family;Available 24 hours/day (mother) Type of Home: House Home Access: Stairs to enter Entrance Stairs-Rails: None Entrance Stairs-Number of Steps: 3   Home Layout: One level Home Equipment: BSC/3in1      Prior Function Prior Level of Function : Independent/Modified Independent             Mobility Comments: ind ADLs Comments: ind     Hand Dominance        Extremity/Trunk Assessment   Upper Extremity Assessment Upper Extremity Assessment: Overall WFL for tasks assessed    Lower Extremity Assessment Lower Extremity Assessment: RLE deficits/detail;LLE deficits/detail RLE Deficits / Details: MMT ank DF/PF 5/5 RLE Sensation: WNL LLE Deficits / Details: MMT ank DF/PF 5/5 LLE Sensation: WNL    Cervical / Trunk Assessment Cervical / Trunk Assessment: Normal  Communication   Communication: No difficulties  Cognition Arousal/Alertness: Awake/alert Behavior During Therapy: WFL for tasks assessed/performed Overall Cognitive Status: Within Functional Limits for tasks assessed                                          General Comments      Exercises Total Joint Exercises Ankle Circles/Pumps: AROM, Both, 10 reps, Supine   Assessment/Plan    PT Assessment Patient needs continued PT services  PT Problem List Decreased strength;Decreased range of motion;Decreased activity tolerance;Decreased balance;Decreased mobility;Decreased coordination;Decreased knowledge of use of DME;Pain       PT Treatment Interventions DME instruction;Gait training;Stair training;Functional mobility training;Therapeutic activities;Therapeutic exercise;Balance training;Neuromuscular re-education;Patient/family education    PT Goals (Current goals can be found in the Care Plan section)  Acute Rehab PT Goals Patient Stated Goal: to walk better and without pain PT Goal Formulation: With patient Time  For Goal Achievement: 10/12/21 Potential to Achieve Goals: Good    Frequency 7X/week     Co-evaluation               AM-PAC PT "6 Clicks" Mobility  Outcome Measure Help needed turning from your back to your side while in a flat bed without using bedrails?: None Help needed moving from lying on your back to sitting on the side of a flat bed without using bedrails?: A Little Help needed moving to and from a bed to a chair (including a wheelchair)?: A Little Help needed standing up from a chair using your arms (e.g., wheelchair or bedside chair)?: A Little Help needed to walk in hospital room?: A Little Help needed climbing 3-5 steps with a railing? : A Little 6 Click Score: 19    End of Session Equipment Utilized During Treatment: Gait belt Activity Tolerance: Other (comment) (nausea, shaking.) Patient left: in bed;with call bell/phone within reach;with bed alarm set;with nursing/sitter in room;with SCD's reapplied Nurse Communication: Mobility status PT Visit Diagnosis: Pain;Difficulty in walking, not elsewhere classified (R26.2) Pain - Right/Left: Left Pain - part of body: Hip    Time: 9323-5573 PT Time Calculation (min) (ACUTE ONLY): 13 min   Charges:   PT Evaluation $PT Eval Low Complexity: 1 Low          Jamesetta Geralds, PT, DPT [facility] Rehabilitation Department  Office: [phone] Pager: [phone]  Jamesetta Geralds 10/05/2021, 6:53 PM

## 2021-10-05 NOTE — Anesthesia Procedure Notes (Signed)
Procedure Name: Intubation Date/Time: 10/05/2021 8:25 AM  Performed by: Lakara Weiland, Forest Gleason, CRNAPre-anesthesia Checklist: Patient identified, Emergency Drugs available, Suction available, Patient being monitored and Timeout performed Patient Re-evaluated:Patient Re-evaluated prior to induction Oxygen Delivery Method: Circle system utilized Preoxygenation: Pre-oxygenation with 100% oxygen Induction Type: IV induction Ventilation: Mask ventilation without difficulty Laryngoscope Size: Mac and 4 Grade View: Grade I Tube type: Oral Tube size: 7.0 mm Number of attempts: 1 Airway Equipment and Method: Stylet Placement Confirmation: ETT inserted through vocal cords under direct vision, positive ETCO2, CO2 detector and breath sounds checked- equal and bilateral Secured at: 21 cm Tube secured with: Tape Dental Injury: Teeth and Oropharynx as per pre-operative assessment

## 2021-10-05 NOTE — Discharge Instructions (Signed)
 Dr. Brian Swinteck Joint Replacement Specialist [facility] [address] [place], [place] [address] [phone]   TOTAL HIP REPLACEMENT POSTOPERATIVE DIRECTIONS    Hip Rehabilitation, Guidelines Following Surgery   WEIGHT BEARING Weight bearing as tolerated with assist device (walker, cane, etc) as directed, use it as long as suggested by your surgeon or therapist, typically at least 4-6 weeks.  The results of a hip operation are greatly improved after range of motion and muscle strengthening exercises. Follow all safety measures which are given to protect your hip. If any of these exercises cause increased pain or swelling in your joint, decrease the amount until you are comfortable again. Then slowly increase the exercises. Call your caregiver if you have problems or questions.   HOME CARE INSTRUCTIONS  Most of the following instructions are designed to prevent the dislocation of your new hip.  Remove items at home which could result in a fall. This includes throw rugs or furniture in walking pathways.  Continue medications as instructed at time of discharge. You may have some home medications which will be placed on hold until you complete the course of blood thinner medication. You may start showering once you are discharged home. Do not remove your dressing. Do not put on socks or shoes without following the instructions of your caregivers.   Sit on chairs with arms. Use the chair arms to help push yourself up when arising.  Arrange for the use of a toilet seat elevator so you are not sitting low.  Walk with walker as instructed.  You may resume a sexual relationship in one month or when given the OK by your caregiver.  Use walker as long as suggested by your caregivers.  You may put full weight on your legs and walk as much as is comfortable. Avoid periods of inactivity such as sitting longer than an hour when not asleep. This helps prevent blood  clots.  You may return to work once you are cleared by your surgeon.  Do not drive a car for 6 weeks or until released by your surgeon.  Do not drive while taking narcotics.  Wear elastic stockings for two weeks following surgery during the day but you may remove then at night.  Make sure you keep all of your appointments after your operation with all of your doctors and caregivers. You should call the office at the above phone number and make an appointment for approximately two weeks after the date of your surgery. Please pick up a stool softener and laxative for home use as long as you are requiring pain medications. ICE to the affected hip every three hours for 30 minutes at a time and then as needed for pain and swelling. Continue to use ice on the hip for pain and swelling from surgery. You may notice swelling that will progress down to the foot and ankle.  This is normal after surgery.  Elevate the leg when you are not up walking on it.   It is important for you to complete the blood thinner medication as prescribed by your doctor. Continue to use the breathing machine which will help keep your temperature down.  It is common for your temperature to cycle up and down following surgery, especially at night when you are not up moving around and exerting yourself.  The breathing machine keeps your lungs expanded and your temperature down.  RANGE OF MOTION AND STRENGTHENING EXERCISES  These exercises are designed to help you   keep full movement of your hip joint. Follow your caregiver's or physical therapist's instructions. Perform all exercises about fifteen times, three times per day or as directed. Exercise both hips, even if you have had only one joint replacement. These exercises can be done on a training (exercise) mat, on the floor, on a table or on a bed. Use whatever works the best and is most comfortable for you. Use music or television while you are exercising so that the exercises are a  pleasant break in your day. This will make your life better with the exercises acting as a break in routine you can look forward to.  ?Lying on your back, slowly slide your foot toward your buttocks, raising your knee up off the floor. Then slowly slide your foot back down until your leg is straight again.  ?Lying on your back spread your legs as far apart as you can without causing discomfort.  ?Lying on your side, raise your upper leg and foot straight up from the floor as far as is comfortable. Slowly lower the leg and repeat.  ?Lying on your back, tighten up the muscle in the front of your thigh (quadriceps muscles). You can do this by keeping your leg straight and trying to raise your heel off the floor. This helps strengthen the largest muscle supporting your knee.  ?Lying on your back, tighten up the muscles of your buttocks both with the legs straight and with the knee bent at a comfortable angle while keeping your heel on the floor.  ? ?SKILLED REHAB INSTRUCTIONS: ?If the patient is transferred to a skilled rehab facility following release from the hospital, a list of the current medications will be sent to the facility for the patient to continue.  When discharged from the skilled rehab facility, please have the facility set up the patient's Home Health Physical Therapy prior to being released. Also, the skilled facility will be responsible for providing the patient with their medications at time of release from the facility to include their pain medication and their blood thinner medication. If the patient is still at the rehab facility at time of the two week follow up appointment, the skilled rehab facility will also need to assist the patient in arranging follow up appointment in our office and any transportation needs. ? ?POST-OPERATIVE OPIOID TAPER INSTRUCTIONS: ?It is important to wean off of your opioid medication as soon as possible. If you do not need pain medication after your surgery it is ok  to stop day one. ?Opioids include: ?Codeine, Hydrocodone(Norco, Vicodin), Oxycodone(Percocet, oxycontin) and hydromorphone amongst others.  ?Long term and even short term use of opiods can cause: ?Increased pain response ?Dependence ?Constipation ?Depression ?Respiratory depression ?And more.  ?Withdrawal symptoms can include ?Flu like symptoms ?Nausea, vomiting ?And more ?Techniques to manage these symptoms ?Hydrate well ?Eat regular healthy meals ?Stay active ?Use relaxation techniques(deep breathing, meditating, yoga) ?Do Not substitute Alcohol to help with tapering ?If you have been on opioids for less than two weeks and do not have pain than it is ok to stop all together.  ?Plan to wean off of opioids ?This plan should start within one week post op of your joint replacement. ?Maintain the same interval or time between taking each dose and first decrease the dose.  ?Cut the total daily intake of opioids by one tablet each day ?Next start to increase the time between doses. ?The last dose that should be eliminated is the evening dose.  ? ? ?MAKE   SURE YOU:  Understand these instructions.  Will watch your condition.  Will get help right away if you are not doing well or get worse.  Pick up stool softner and laxative for home use following surgery while on pain medications. Do not remove your dressing. The dressing is waterproof--it is OK to take showers. Continue to use ice for pain and swelling after surgery. Do not use any lotions or creams on the incision until instructed by your surgeon. Total Hip Protocol. You may take tylenol as needed for pain 1,000mg  every 8 hours. Do not exceed 4,000mg  per day.  Please do not take tramadol and hydrocodone at the same time.  Do not take the aspirin instead take the eliquis for DVT prophylaxis.  Information on my medicine - ELIQUIS (apixaban)  This medication education was reviewed with me or my healthcare representative as part of my discharge preparation.   The pharmacist that spoke with me during my hospital stay was:    Why was Eliquis prescribed for you? Eliquis was prescribed for you to reduce the risk of blood clots forming after orthopedic surgery.    What do You need to know about Eliquis? Take your Eliquis TWICE DAILY - one tablet in the morning and one tablet in the evening with or without food.  It would be best to take the dose about the same time each day.  If you have difficulty swallowing the tablet whole please discuss with your pharmacist how to take the medication safely.  Take Eliquis exactly as prescribed by your doctor and DO NOT stop taking Eliquis without talking to the doctor who prescribed the medication.  Stopping without other medication to take the place of Eliquis may increase your risk of developing a clot.  After discharge, you should have regular check-up appointments with your healthcare provider that is prescribing your Eliquis.  What do you do if you miss a dose? If a dose of ELIQUIS is not taken at the scheduled time, take it as soon as possible on the same day and twice-daily administration should be resumed.  The dose should not be doubled to make up for a missed dose.  Do not take more than one tablet of ELIQUIS at the same time.  Important Safety Information A possible side effect of Eliquis is bleeding. You should call your healthcare provider right away if you experience any of the following: Bleeding from an injury or your nose that does not stop. Unusual colored urine (red or dark brown) or unusual colored stools (red or black). Unusual bruising for unknown reasons. A serious fall or if you hit your head (even if there is no bleeding).  Some medicines may interact with Eliquis and might increase your risk of bleeding or clotting while on Eliquis. To help avoid this, consult your healthcare provider or pharmacist prior to using any new prescription or non-prescription medications, including  herbals, vitamins, non-steroidal anti-inflammatory drugs (NSAIDs) and supplements.  This website has more information on Eliquis (apixaban): http://www.eliquis.com/eliquis/home

## 2021-10-05 NOTE — Op Note (Signed)
OPERATIVE REPORT  SURGEON: Samson Frederic, MD   ASSISTANT: Clint Bolder, PA-C.  PREOPERATIVE DIAGNOSIS: Left hip arthritis.   POSTOPERATIVE DIAGNOSIS: Left hip arthritis.   PROCEDURE: Left total hip arthroplasty, anterior approach.   IMPLANTS: Biomet Taperloc Complete Microplasty stem, size 6 x 97.5 mm, standard offset. Biomet G7 OsseoTi Cup, size 50 mm. Biomet Vivacit-E liner, size 32 mm, D, neutral. Biomet Biolox ceramic head ball, size 32 + 0 mm.  ANESTHESIA:  General  ESTIMATED BLOOD LOSS:-250 mL    ANTIBIOTICS: 2 g Ancef.  DRAINS: None.  COMPLICATIONS: None.   CONDITION: PACU - hemodynamically stable.   BRIEF CLINICAL NOTE: Kathy Harrison is a 51 y.o. female with a long-standing history of Left hip arthritis. After failing conservative management, the patient was indicated for total hip arthroplasty. The risks, benefits, and alternatives to the procedure were explained, and the patient elected to proceed.  PROCEDURE IN DETAIL: Surgical site was marked by myself in the pre-op holding area. Once inside the operating room, spinal anesthesia was attempted unsuccessfully. General anesthesia was obtained, and a foley catheter was inserted. The patient was then positioned on the Hana table.  All bony prominences were well padded.  The hip was prepped and draped in the normal sterile surgical fashion.  A time-out was called verifying side and site of surgery. The patient received IV antibiotics within 60 minutes of beginning the procedure.   Bikini incision was made, and superficial dissection was performed lateral to the ASIS. The direct anterior approach to the hip was performed through the Hueter interval.  Lateral femoral circumflex vessels were treated with the Auqumantys. The anterior capsule was exposed and an inverted T capsulotomy was made. The femoral neck cut was made to the level of the templated cut.  A corkscrew was placed into the head and the head was removed.  The  femoral head was found to have eburnated bone. The head was passed to the back table and was measured. Pubofemoral ligament was released off of the calcar, taking care to stay on bone. Superior capsule was released from the greater trochanter, taking care to stay lateral to the posterior border of the femoral neck in order to preserve the short external rotators.   Acetabular exposure was achieved, and the pulvinar and labrum were excised. Sequential reaming of the acetabulum was then performed up to a size 49 mm reamer. A 50 mm cup was then opened and impacted into place at approximately 40 degrees of abduction and 20 degrees of anteversion. The final polyethylene liner was impacted into place and acetabular osteophytes were removed.    I then gained femoral exposure taking care to protect the abductors and greater trochanter.  This was performed using standard external rotation, extension, and adduction.  A cookie cutter was used to enter the femoral canal, and then the femoral canal finder was placed.  Sequential broaching was performed up to a size 6.  Calcar planer was used on the femoral neck remnant.  I placed a standard offset neck and a trial head ball.  The hip was reduced.  Leg lengths and offset were checked fluoroscopically.  The hip was dislocated and trial components were removed.  The final implants were placed, and the hip was reduced.  Fluoroscopy was used to confirm component position and leg lengths.  At 90 degrees of external rotation and full extension, the hip was stable to an anterior directed force.   The wound was copiously irrigated with Prontosan solution and normal saline  using pule lavage.  Marcaine solution was injected into the periarticular soft tissue.  The wound was closed in layers using #1 Vicryl and V-Loc for the fascia, 2-0 Vicryl for the subcutaneous fat, 2-0 Monocryl for the deep dermal layer, 3-0 running Monocryl subcuticular stitch, and Dermabond for the skin.  Once  the glue was fully dried, an Aquacell Ag dressing was applied.  The patient was transported to the recovery room in stable condition.  Sponge, needle, and instrument counts were correct at the end of the case x2.  The patient tolerated the procedure well and there were no known complications.  Please note that a surgical assistant was a medical necessity for this procedure to perform it in a safe and expeditious manner. Assistant was necessary to provide appropriate retraction of vital neurovascular structures, to prevent femoral fracture, and to allow for anatomic placement of the prosthesis.

## 2021-10-05 NOTE — Interval H&P Note (Signed)
History and Physical Interval Note:  10/05/2021 7:58 AM  Kathy Harrison  has presented today for surgery, with the diagnosis of left hip osteoarthritis.  The various methods of treatment have been discussed with the patient and family. After consideration of risks, benefits and other options for treatment, the patient has consented to  Procedure(s) with comments: TOTAL HIP ARTHROPLASTY ANTERIOR APPROACH (Left) - 150 as a surgical intervention.  The patient's history has been reviewed, patient examined, no change in status, stable for surgery.  I have reviewed the patient's chart and labs.  Questions were answered to the patient's satisfaction.     [name]

## 2021-10-05 NOTE — Transfer of Care (Signed)
Immediate Anesthesia Transfer of Care Note  Patient: Kathy Harrison  Procedure(s) Performed: TOTAL HIP ARTHROPLASTY ANTERIOR APPROACH (Left: Hip)  Patient Location: PACU  Anesthesia Type:General  Level of Consciousness: awake and responds to stimulation  Airway & Oxygen Therapy: Patient Spontanous Breathing  Post-op Assessment: Report given to RN  Post vital signs: stable  Last Vitals:  Vitals Value Taken Time  BP 124/69 10/05/21 1053  Temp    Pulse 79 10/05/21 1100  Resp 19 10/05/21 1100  SpO2 95 % 10/05/21 1100  Vitals shown include unvalidated device data.  Last Pain:  Vitals:   10/05/21 0609  TempSrc:   PainSc: 7       Patients Stated Pain Goal: 5 (10/05/21 9292)  Complications: No notable events documented.

## 2021-10-05 NOTE — Anesthesia Postprocedure Evaluation (Signed)
Anesthesia Post Note  Patient: Kathy Harrison  Procedure(s) Performed: TOTAL HIP ARTHROPLASTY ANTERIOR APPROACH (Left: Hip)     Patient location during evaluation: PACU Anesthesia Type: General Level of consciousness: awake and alert, patient cooperative and oriented Pain management: pain level controlled Vital Signs Assessment: post-procedure vital signs reviewed and stable Respiratory status: spontaneous breathing, nonlabored ventilation, respiratory function stable and patient connected to nasal cannula oxygen Cardiovascular status: blood pressure returned to baseline and stable Postop Assessment: no apparent nausea or vomiting Anesthetic complications: no Comments: Dr. [name] to admit pt overnight since she has been so sleepy post op. Awake presently, feeling better   No notable events documented.  Last Vitals:  Vitals:   10/05/21 1530 10/05/21 1545  BP: 102/70 96/64  Pulse: 60 73  Resp: 14 13  Temp:    SpO2: 96% 94%    Last Pain:  Vitals:   10/05/21 1545  TempSrc:   PainSc: Asleep                 Johnedward Brodrick,E. Lenay Lovejoy

## 2021-10-05 NOTE — Anesthesia Procedure Notes (Signed)
Spinal  Start time: 10/05/2021 8:11 AM End time: 10/05/2021 8:17 AM Staffing Performed: anesthesiologist  Anesthesiologist: Jairo Ben, MD Performed by: Jairo Ben, MD Authorized by: Jairo Ben, MD   Preanesthetic Checklist Completed: patient identified, IV checked, site marked, risks and benefits discussed, surgical consent, monitors and equipment checked, pre-op evaluation and timeout performed Spinal Block Patient position: sitting Prep: DuraPrep and site prepped and draped Patient monitoring: blood pressure, continuous pulse ox, cardiac monitor and heart rate Approach: midline Location: L3-4 Needle Needle type: Pencan  Needle gauge: 24 G Needle length: 9 cm Assessment Events: failed spinal Additional Notes All attempts os, proceed to GA

## 2021-10-06 DIAGNOSIS — M1612 Unilateral primary osteoarthritis, left hip: Secondary | ICD-10-CM | POA: Diagnosis not present

## 2021-10-06 MED ORDER — APIXABAN 2.5 MG PO TABS: 2.5000 mg | ORAL_TABLET | Freq: Two times a day (BID) | ORAL | Status: DC

## 2021-10-06 MED ORDER — TRAMADOL HCL 50 MG PO TABS: 50.0000 mg | ORAL_TABLET | Freq: Four times a day (QID) | ORAL | 0 refills | Status: AC | PRN

## 2021-10-06 NOTE — Progress Notes (Signed)
    Subjective:  Patient reports pain as mild to moderate.  Denies N/V/CP/SOB/Abd pain currently. Patient reports that she did have nausea after surgery yesterday and overnight. She states she is feeling better now. She reports that she was able to got to the bathroom with assistance 4 times since yesterday and it went well. She is looking forward to going home.   Patient had some questions about general anesthesia yesterday. All questions welcomed and addressed.   Patient was asking about other medications besides narcotics she can take we, discussed this morning.   Objective:   VITALS:   Vitals:   10/05/21 1719 10/05/21 2138 10/06/21 0058 10/06/21 0435  BP: 114/68 119/73 132/72 118/71  Pulse: 60 78 76 70  Resp: 14 16 16 20   Temp: (!) 97.5 F (36.4 C) 98.2 F (36.8 C) 98.5 F (36.9 C) 98.2 F (36.8 C)  TempSrc:  Oral Oral Oral  SpO2: 100% 100% 100% 100%  Weight:      Height:        Patient lying comfortably in bed with ice packs on left leg. NAD. Neurologically intact ABD soft Neurovascular intact Sensation intact distally Intact pulses distally Dorsiflexion/Plantar flexion intact Incision: dressing C/D/I No cellulitis present Compartment soft   Lab Results  Component Value Date   WBC 4.4 09/30/2021   HGB 13.8 09/30/2021   HCT 41.6 09/30/2021   MCV 80.2 09/30/2021   PLT 289 09/30/2021   BMET    Component Value Date/Time   NA 140 09/30/2021 0949   K 3.4 (L) 09/30/2021 0949   CL 104 09/30/2021 0949   CO2 27 09/30/2021 0949   GLUCOSE 93 09/30/2021 0949   BUN 18 09/30/2021 0949   CREATININE 0.83 09/30/2021 0949   CREATININE 0.75 06/10/2015 1559   CALCIUM 9.8 09/30/2021 0949   GFRNONAA >60 09/30/2021 0949     Assessment/Plan: 1 Day Post-Op   Principal Problem:   S/P total hip arthroplasty Active Problems:   S/P total left hip arthroplasty   WBAT with walker DVT ppx:  Eliquis 2.5mg  BID due to aspirin intolerance , SCDs, TEDS PO pain  control PT/OT: PT was limited due to nausea and anesthesia effects. PT to come by today.  Dispo: D/c home with home exercise program after cleared with PT. She will follow-up in the office in 2 weeks.    10/02/2021, PA-C 10/06/2021, 7:11 AM  [facility]  [place] [address], , [place] Phone: [phone] [email or web address] Facebook  [phone]

## 2021-10-06 NOTE — Discharge Summary (Signed)
Physician Discharge Summary  Patient ID: MERRIEL ZINGER MRN: [id number] DOB/AGE: [birth year] 51 y.o.  Admit date: 10/05/2021 Discharge date: 10/06/2021  Admission Diagnoses:  S/P total hip arthroplasty  Discharge Diagnoses:  Principal Problem:   S/P total hip arthroplasty Active Problems:   S/P total left hip arthroplasty   Past Medical History:  Diagnosis Date   Abnormal Pap smear of cervix    neg HPV HR 16,18/45   Hypertension    PONV (postoperative nausea and vomiting)     Surgeries: Procedure(s): TOTAL HIP ARTHROPLASTY ANTERIOR APPROACH on 10/05/2021   Consultants (if any):   Discharged Condition: Improved  Hospital Course: Bettylee D Bennetts is an 51 y.o. female who was admitted 10/05/2021 with a diagnosis of S/P total hip arthroplasty and went to the operating room on 10/05/2021 and underwent the above named procedures.    She was given perioperative antibiotics:  Anti-infectives (From admission, onward)    Start     Dose/Rate Route Frequency Ordered Stop   10/05/21 1400  ceFAZolin (ANCEF) IVPB 2g/100 mL premix        2 g 200 mL/hr over 30 Minutes Intravenous Every 6 hours 10/05/21 1053 10/06/21 0012   10/05/21 0600  ceFAZolin (ANCEF) IVPB 2g/100 mL premix        2 g 200 mL/hr over 30 Minutes Intravenous On call to O.R. 10/05/21 0526 10/05/21 0815       She was given sequential compression devices, early ambulation, and Eliquis 2.5mg  BID for DVT prophylaxis.  Patient was kept overnight due to difficulty waking up from anesthesia, nausea and inability to ambulate with PT.   POD#1 patient's nausea improved and she was feeling much better she ambulated with PT 150 feet.   She benefited maximally from the hospital stay and there were no complications.    Recent vital signs:  Vitals:   10/06/21 0435 10/06/21 0929  BP: 118/71 111/68  Pulse: 70 74  Resp: 20 20  Temp: 98.2 F (36.8 C) 98.6 F (37 C)  SpO2: 100% 100%    Recent laboratory studies:  Lab Results   Component Value Date   HGB 13.8 09/30/2021   HGB 14.2 01/16/2017   HGB 12.4 08/13/2015   Lab Results  Component Value Date   WBC 4.4 09/30/2021   PLT 289 09/30/2021   No results found for: "INR" Lab Results  Component Value Date   NA 140 09/30/2021   K 3.4 (L) 09/30/2021   CL 104 09/30/2021   CO2 27 09/30/2021   BUN 18 09/30/2021   CREATININE 0.83 09/30/2021   GLUCOSE 93 09/30/2021     Allergies as of 10/06/2021       Reactions   Aspirin Nausea And Vomiting   If not coated         Medication List     STOP taking these medications    acetaminophen 650 MG CR tablet Commonly known as: TYLENOL       TAKE these medications    amLODipine 5 MG tablet Commonly known as: NORVASC Take 5 mg by mouth at bedtime.   apixaban 2.5 MG Tabs tablet Commonly known as: Eliquis Take 1 tablet (2.5 mg total) by mouth 2 (two) times daily.   CORICIDIN 2-325 MG Tabs Generic drug: Chlorpheniramine-APAP Take 1 tablet by mouth daily as needed (congestion).   docusate sodium 100 MG capsule Commonly known as: Colace Take 1 capsule (100 mg total) by mouth 2 (two) times daily.   HYDROcodone-acetaminophen 10-325 MG tablet Commonly known  as: Norco Take 0.5 tablets by mouth every 4 (four) hours as needed for up to 7 days for moderate pain or severe pain.   methocarbamol 500 MG tablet Commonly known as: ROBAXIN Take 1 tablet (500 mg total) by mouth every 6 (six) hours as needed for muscle spasms.   ondansetron 4 MG tablet Commonly known as: Zofran Take 1 tablet (4 mg total) by mouth every 8 (eight) hours as needed for nausea or vomiting.   polyethylene glycol 17 g packet Commonly known as: MiraLax Take 17 g by mouth daily as needed for mild constipation or moderate constipation.   senna 8.6 MG Tabs tablet Commonly known as: SENOKOT Take 2 tablets (17.2 mg total) by mouth at bedtime for 15 days.   traMADol 50 MG tablet Commonly known as: Ultram Take 1 tablet (50 mg total)  by mouth every 6 (six) hours as needed for up to 7 days.   valsartan-hydrochlorothiazide 160-25 MG tablet Commonly known as: DIOVAN-HCT Take 1 tablet by mouth daily.               Discharge Care Instructions  (From admission, onward)           Start     Ordered   10/06/21 0000  Weight bearing as tolerated        10/06/21 0721   10/06/21 0000  Change dressing       Comments: Do not change your dressing.   10/06/21 0721   10/05/21 0000  Weight bearing as tolerated        10/05/21 0731   10/05/21 0000  Change dressing       Comments: Do not change your dressing   10/05/21 0731              WEIGHT BEARING   Weight bearing as tolerated with assist device (walker, cane, etc) as directed, use it as long as suggested by your surgeon or therapist, typically at least 4-6 weeks.   EXERCISES  Results after joint replacement surgery are often greatly improved when you follow the exercise, range of motion and muscle strengthening exercises prescribed by your doctor. Safety measures are also important to protect the joint from further injury. Any time any of these exercises cause you to have increased pain or swelling, decrease what you are doing until you are comfortable again and then slowly increase them. If you have problems or questions, call your caregiver or physical therapist for advice.   Rehabilitation is important following a joint replacement. After just a few days of immobilization, the muscles of the leg can become weakened and shrink (atrophy).  These exercises are designed to build up the tone and strength of the thigh and leg muscles and to improve motion. Often times heat used for twenty to thirty minutes before working out will loosen up your tissues and help with improving the range of motion but do not use heat for the first two weeks following surgery (sometimes heat can increase post-operative swelling).   These exercises can be done on a training (exercise)  mat, on the floor, on a table or on a bed. Use whatever works the best and is most comfortable for you.    Use music or television while you are exercising so that the exercises are a pleasant break in your day. This will make your life better with the exercises acting as a break in your routine that you can look forward to.   Perform all exercises about fifteen times,  three times per day or as directed.  You should exercise both the operative leg and the other leg as well.  Exercises include:   Quad Sets - Tighten up the muscle on the front of the thigh (Quad) and hold for 5-10 seconds.   Straight Leg Raises - With your knee straight (if you were given a brace, keep it on), lift the leg to 60 degrees, hold for 3 seconds, and slowly lower the leg.  Perform this exercise against resistance later as your leg gets stronger.  Leg Slides: Lying on your back, slowly slide your foot toward your buttocks, bending your knee up off the floor (only go as far as is comfortable). Then slowly slide your foot back down until your leg is flat on the floor again.  Angel Wings: Lying on your back spread your legs to the side as far apart as you can without causing discomfort.  Hamstring Strength:  Lying on your back, push your heel against the floor with your leg straight by tightening up the muscles of your buttocks.  Repeat, but this time bend your knee to a comfortable angle, and push your heel against the floor.  You may put a pillow under the heel to make it more comfortable if necessary.   A rehabilitation program following joint replacement surgery can speed recovery and prevent re-injury in the future due to weakened muscles. Contact your doctor or a physical therapist for more information on knee rehabilitation.    CONSTIPATION  Constipation is defined medically as fewer than three stools per week and severe constipation as less than one stool per week.  Even if you have a regular bowel pattern at home, your  normal regimen is likely to be disrupted due to multiple reasons following surgery.  Combination of anesthesia, postoperative narcotics, change in appetite and fluid intake all can affect your bowels.   YOU MUST use at least one of the following options; they are listed in order of increasing strength to get the job done.  They are all available over the counter, and you may need to use some, POSSIBLY even all of these options:    Drink plenty of fluids (prune juice may be helpful) and high fiber foods Colace 100 mg by mouth twice a day  Senokot for constipation as directed and as needed Dulcolax (bisacodyl), take with full glass of water  Miralax (polyethylene glycol) once or twice a day as needed.  If you have tried all these things and are unable to have a bowel movement in the first 3-4 days after surgery call either your surgeon or your primary doctor.    If you experience loose stools or diarrhea, hold the medications until you stool forms back up.  If your symptoms do not get better within 1 week or if they get worse, check with your doctor.  If you experience "the worst abdominal pain ever" or develop nausea or vomiting, please contact the office immediately for further recommendations for treatment.   ITCHING:  If you experience itching with your medications, try taking only a single pain pill, or even half a pain pill at a time.  You can also use Benadryl over the counter for itching or also to help with sleep.   TED HOSE STOCKINGS:  Use stockings on both legs until for at least 2 weeks or as directed by physician office. They may be removed at night for sleeping.  MEDICATIONS:  See your medication summary on the "After Visit  Summary" that nursing will review with you.  You may have some home medications which will be placed on hold until you complete the course of blood thinner medication.  It is important for you to complete the blood thinner medication as prescribed.  PRECAUTIONS:   If you experience chest pain or shortness of breath - call 911 immediately for transfer to the hospital emergency department.   If you develop a fever greater that 101 F, purulent drainage from wound, increased redness or drainage from wound, foul odor from the wound/dressing, or calf pain - CONTACT YOUR SURGEON.                                                   FOLLOW-UP APPOINTMENTS:  If you do not already have a post-op appointment, please call the office for an appointment to be seen by your surgeon.  Guidelines for how soon to be seen are listed in your "After Visit Summary", but are typically between 1-4 weeks after surgery.   MAKE SURE YOU:  Understand these instructions.  Get help right away if you are not doing well or get worse.    Thank you for letting us be a part of your medical care team.  It is a privilege we respect greatly.  We hope these instructions will help you stay on track for a fast and full recovery!   Diagnostic Studies: DG Pelvis Portable  Result Date: 10/05/2021 CLINICAL DATA:  Left hip replacement EXAM: PORTABLE PELVIS 1-2 VIEWS COMPARISON:  08/02/2007 FINDINGS: Interval postsurgical changes from left total hip arthroplasty. Arthroplasty components are in their expected alignment. No periprosthetic fracture or evidence of other complication. Expected postoperative changes within the overlying soft tissues. IMPRESSION: Interval postsurgical changes from left total hip arthroplasty. Electronically Signed   By: Duanne Guess D.O.   On: 10/05/2021 11:40   DG HIP UNILAT WITH PELVIS 1V LEFT  Result Date: 10/05/2021 CLINICAL DATA:  51 year old female undergoing left hip arthroplasty. EXAM: DG HIP (WITH OR WITHOUT PELVIS) 1V*L* COMPARISON:  None Available. FINDINGS: Intraoperative fluoroscopic image demonstrates left total hip arthroplasty. No evidence of hardware loosening, fracture, or malalignment. IMPRESSION: Intraoperative fluoroscopic image of left total hip  arthroplasty without complicating features. Electronically Signed   By: Marliss Coots M.D.   On: 10/05/2021 10:08   DG C-Arm 1-60 Min-No Report  Result Date: 10/05/2021 Fluoroscopy was utilized by the requesting physician.  No radiographic interpretation.   DG C-Arm 1-60 Min-No Report  Result Date: 10/05/2021 Fluoroscopy was utilized by the requesting physician.  No radiographic interpretation.    Disposition: Discharge disposition: 01-Home or Self Care       Discharge Instructions     Call MD / Call 911   Complete by: As directed    If you experience chest pain or shortness of breath, CALL 911 and be transported to the hospital emergency room.  If you develope a fever above 101 F, pus (white drainage) or increased drainage or redness at the wound, or calf pain, call your surgeon's office.   Call MD / Call 911   Complete by: As directed    If you experience chest pain or shortness of breath, CALL 911 and be transported to the hospital emergency room.  If you develope a fever above 101 F, pus (white drainage) or increased drainage or redness at the  wound, or calf pain, call your surgeon's office.   Change dressing   Complete by: As directed    Do not change your dressing   Change dressing   Complete by: As directed    Do not change your dressing.   Constipation Prevention   Complete by: As directed    Drink plenty of fluids.  Prune juice may be helpful.  You may use a stool softener, such as Colace (over the counter) 100 mg twice a day.  Use MiraLax (over the counter) for constipation as needed.   Constipation Prevention   Complete by: As directed    Drink plenty of fluids.  Prune juice may be helpful.  You may use a stool softener, such as Colace (over the counter) 100 mg twice a day.  Use MiraLax (over the counter) for constipation as needed.   Diet - low sodium heart healthy   Complete by: As directed    Discharge instructions   Complete by: As directed    Elevate toes  above nose. Use cryotherapy as needed for pain and swelling.   Discharge instructions   Complete by: As directed    Elevate toes above nose. Use cryotherapy as needed for pain and swelling.   Driving restrictions   Complete by: As directed    No driving for 6 weeks   Driving restrictions   Complete by: As directed    No driving for 6 weeks   Increase activity slowly as tolerated   Complete by: As directed    Increase activity slowly as tolerated   Complete by: As directed    Lifting restrictions   Complete by: As directed    No lifting for 6 weeks   Lifting restrictions   Complete by: As directed    No lifting for 6 weeks   Post-operative opioid taper instructions:   Complete by: As directed    POST-OPERATIVE OPIOID TAPER INSTRUCTIONS: It is important to wean off of your opioid medication as soon as possible. If you do not need pain medication after your surgery it is ok to stop day one. Opioids include: Codeine, Hydrocodone(Norco, Vicodin), Oxycodone(Percocet, oxycontin) and hydromorphone amongst others.  Long term and even short term use of opiods can cause: Increased pain response Dependence Constipation Depression Respiratory depression And more.  Withdrawal symptoms can include Flu like symptoms Nausea, vomiting And more Techniques to manage these symptoms Hydrate well Eat regular healthy meals Stay active Use relaxation techniques(deep breathing, meditating, yoga) Do Not substitute Alcohol to help with tapering If you have been on opioids for less than two weeks and do not have pain than it is ok to stop all together.  Plan to wean off of opioids This plan should start within one week post op of your joint replacement. Maintain the same interval or time between taking each dose and first decrease the dose.  Cut the total daily intake of opioids by one tablet each day Next start to increase the time between doses. The last dose that should be eliminated is the  evening dose.      Post-operative opioid taper instructions:   Complete by: As directed    POST-OPERATIVE OPIOID TAPER INSTRUCTIONS: It is important to wean off of your opioid medication as soon as possible. If you do not need pain medication after your surgery it is ok to stop day one. Opioids include: Codeine, Hydrocodone(Norco, Vicodin), Oxycodone(Percocet, oxycontin) and hydromorphone amongst others.  Long term and even short term use of opiods  can cause: Increased pain response Dependence Constipation Depression Respiratory depression And more.  Withdrawal symptoms can include Flu like symptoms Nausea, vomiting And more Techniques to manage these symptoms Hydrate well Eat regular healthy meals Stay active Use relaxation techniques(deep breathing, meditating, yoga) Do Not substitute Alcohol to help with tapering If you have been on opioids for less than two weeks and do not have pain than it is ok to stop all together.  Plan to wean off of opioids This plan should start within one week post op of your joint replacement. Maintain the same interval or time between taking each dose and first decrease the dose.  Cut the total daily intake of opioids by one tablet each day Next start to increase the time between doses. The last dose that should be eliminated is the evening dose.      TED hose   Complete by: As directed    Use stockings (TED hose) for 2 weeks on both leg(s).  You may remove them at night for sleeping.   TED hose   Complete by: As directed    Use stockings (TED hose) for 2 weeks on both leg(s).  You may remove them at night for sleeping.   Weight bearing as tolerated   Complete by: As directed    Weight bearing as tolerated   Complete by: As directed         Follow-up Information     Swinteck, Arlys John, MD Follow up in 2 week(s).   Specialty: Orthopedic Surgery Why: For wound re-check Contact information: [address] [place]  [address] [phone]                  Signed: Clois Dupes, PA-C 10/06/2021, 4:49 PM

## 2021-10-06 NOTE — Progress Notes (Signed)
Physical Therapy Treatment Patient Details Name: Kathy Harrison MRN: [id number] DOB: 1970/08/10 Today's Date: 10/06/2021   History of Present Illness Pt is a 51yo female presenting sp L-THA, AA on 10/05/21. PMH: HTN, PONV.    PT Comments    Pt assisted with ambulating in hallway, practicing safe stair technique and performing LE exercises.  Pt provided with stair and HEP handouts.  Pt feels ready for d/c home and had no further questions.   Recommendations for follow up therapy are one component of a multi-disciplinary discharge planning process, led by the attending physician.  Recommendations may be updated based on patient status, additional functional criteria and insurance authorization.  Follow Up Recommendations  Follow physician's recommendations for discharge plan and follow up therapies     Assistance Recommended at Discharge Intermittent Supervision/Assistance  Patient can return home with the following A little help with walking and/or transfers;A little help with bathing/dressing/bathroom;Assistance with cooking/housework;Assist for transportation;Help with stairs or ramp for entrance   Equipment Recommendations  Rolling walker (2 wheels)    Recommendations for Other Services       Precautions / Restrictions Precautions Precautions: Fall Restrictions Weight Bearing Restrictions: No Other Position/Activity Restrictions: WBAT     Mobility  Bed Mobility               General bed mobility comments: pt in recliner    Transfers Overall transfer level: Needs assistance Equipment used: Rolling walker (2 wheels) Transfers: Sit to/from Stand Sit to Stand: Min guard           General transfer comment: verbal cues for positioning    Ambulation/Gait Ambulation/Gait assistance: Min guard Gait Distance (Feet): 150 Feet Assistive device: Rolling walker (2 wheels) Gait Pattern/deviations: Step-to pattern, Step-through pattern, Decreased stance time - left        General Gait Details: verbal cues for sequence, RW positioning, step length   Stairs Stairs: Yes Stairs assistance: Min guard Stair Management: Step to pattern, Backwards, With walker Number of Stairs: 3 General stair comments: verbal cues for sequence, RW positioning, safety; pt aware second person needs to assist with holding RW, provided handout   Wheelchair Mobility    Modified Rankin (Stroke Patients Only)       Balance                                            Cognition Arousal/Alertness: Awake/alert Behavior During Therapy: WFL for tasks assessed/performed Overall Cognitive Status: Within Functional Limits for tasks assessed                                          Exercises Total Joint Exercises Ankle Circles/Pumps: AROM, Both, 10 reps Quad Sets: AROM, Left, 10 reps Short Arc Quad: AROM, Left, 10 reps Heel Slides: AAROM, Left, 10 reps Hip ABduction/ADduction: AROM, Left, Standing, Supine, 10 reps Long Arc Quad: AROM, Left, Seated, 10 reps Knee Flexion: AROM, Left, 10 reps, Standing Marching in Standing: AROM, Left, Standing, 10 reps Standing Hip Extension: AROM, Left, Standing, 10 reps    General Comments        Pertinent Vitals/Pain Pain Assessment Pain Assessment: 0-10 Pain Score: 4  Pain Location: L hip Pain Descriptors / Indicators: Sore, Burning Pain Intervention(s): Repositioned, Monitored during session, Ice applied  Home Living                          Prior Function            PT Goals (current goals can now be found in the care plan section) Progress towards PT goals: Progressing toward goals    Frequency    7X/week      PT Plan Current plan remains appropriate    Co-evaluation              AM-PAC PT "6 Clicks" Mobility   Outcome Measure  Help needed turning from your back to your side while in a flat bed without using bedrails?: None Help needed moving from  lying on your back to sitting on the side of a flat bed without using bedrails?: None Help needed moving to and from a bed to a chair (including a wheelchair)?: A Little Help needed standing up from a chair using your arms (e.g., wheelchair or bedside chair)?: A Little Help needed to walk in hospital room?: A Little Help needed climbing 3-5 steps with a railing? : A Little 6 Click Score: 20    End of Session Equipment Utilized During Treatment: Gait belt Activity Tolerance: Patient tolerated treatment well Patient left: in chair;with call bell/phone within reach Nurse Communication: Mobility status PT Visit Diagnosis: Difficulty in walking, not elsewhere classified (R26.2)     Time: 0539-7673 PT Time Calculation (min) (ACUTE ONLY): 26 min  Charges:  $Gait Training: 8-22 mins $Therapeutic Exercise: 8-22 mins                    Thomasene Mohair PT, DPT Physical Therapist Acute Rehabilitation Services Preferred contact method: Secure Chat Weekend Pager Only: [phone] Office: [phone]    Kathy Harrison 10/06/2021, 11:21 AM

## 2021-10-07 ENCOUNTER — Encounter (HOSPITAL_COMMUNITY): Payer: Self-pay | Admitting: Orthopedic Surgery

## 2021-10-14 ENCOUNTER — Other Ambulatory Visit: Payer: Self-pay

## 2021-10-14 ENCOUNTER — Encounter (HOSPITAL_COMMUNITY): Payer: Self-pay

## 2021-10-14 ENCOUNTER — Inpatient Hospital Stay (HOSPITAL_COMMUNITY)
Admission: EM | Admit: 2021-10-14 | Discharge: 2021-10-17 | DRG: 872 | Disposition: A | Discharge status: Home / Self Care | Payer: BC Managed Care – PPO | Attending: Family Medicine | Admitting: Family Medicine

## 2021-10-14 ENCOUNTER — Emergency Department (HOSPITAL_COMMUNITY): Payer: BC Managed Care – PPO

## 2021-10-14 DIAGNOSIS — A419 Sepsis, unspecified organism: Secondary | ICD-10-CM

## 2021-10-14 DIAGNOSIS — Z886 Allergy status to analgesic agent status: Secondary | ICD-10-CM

## 2021-10-14 DIAGNOSIS — Z8249 Family history of ischemic heart disease and other diseases of the circulatory system: Secondary | ICD-10-CM

## 2021-10-14 DIAGNOSIS — M1990 Unspecified osteoarthritis, unspecified site: Secondary | ICD-10-CM | POA: Diagnosis present

## 2021-10-14 DIAGNOSIS — E872 Acidosis, unspecified: Secondary | ICD-10-CM | POA: Diagnosis present

## 2021-10-14 DIAGNOSIS — Z823 Family history of stroke: Secondary | ICD-10-CM

## 2021-10-14 DIAGNOSIS — R6520 Severe sepsis without septic shock: Secondary | ICD-10-CM

## 2021-10-14 DIAGNOSIS — Z79899 Other long term (current) drug therapy: Secondary | ICD-10-CM | POA: Diagnosis not present

## 2021-10-14 DIAGNOSIS — E876 Hypokalemia: Secondary | ICD-10-CM

## 2021-10-14 DIAGNOSIS — I10 Essential (primary) hypertension: Secondary | ICD-10-CM | POA: Diagnosis present

## 2021-10-14 DIAGNOSIS — I959 Hypotension, unspecified: Secondary | ICD-10-CM | POA: Diagnosis present

## 2021-10-14 DIAGNOSIS — Z96642 Presence of left artificial hip joint: Secondary | ICD-10-CM | POA: Diagnosis present

## 2021-10-14 DIAGNOSIS — Z20822 Contact with and (suspected) exposure to covid-19: Secondary | ICD-10-CM | POA: Diagnosis present

## 2021-10-14 DIAGNOSIS — R509 Fever, unspecified: Secondary | ICD-10-CM | POA: Diagnosis present

## 2021-10-14 DIAGNOSIS — Z7901 Long term (current) use of anticoagulants: Secondary | ICD-10-CM | POA: Diagnosis not present

## 2021-10-14 DIAGNOSIS — J9600 Acute respiratory failure, unspecified whether with hypoxia or hypercapnia: Secondary | ICD-10-CM

## 2021-10-14 HISTORY — DX: Hypokalemia: E87.6

## 2021-10-14 HISTORY — DX: Sepsis, unspecified organism: A41.9

## 2021-10-14 LAB — URINALYSIS, ROUTINE W REFLEX MICROSCOPIC
Bilirubin Urine: NEGATIVE
Glucose, UA: NEGATIVE mg/dL
Hgb urine dipstick: NEGATIVE
Ketones, ur: NEGATIVE mg/dL
Leukocytes,Ua: NEGATIVE
Nitrite: NEGATIVE
Protein, ur: NEGATIVE mg/dL
Specific Gravity, Urine: 1.011 (ref 1.005–1.030)
pH: 6 (ref 5.0–8.0)

## 2021-10-14 LAB — I-STAT BETA HCG BLOOD, ED (MC, WL, AP ONLY): I-stat hCG, quantitative: <5 m[IU]/mL (ref <5)

## 2021-10-14 LAB — COMPREHENSIVE METABOLIC PANEL
ALT: 36 U/L (ref 0–44)
AST: 45 U/L — ABNORMAL HIGH (ref 15–41)
Albumin: 3 g/dL — ABNORMAL LOW (ref 3.5–5.0)
Alkaline Phosphatase: 84 U/L (ref 38–126)
Anion gap: 8 (ref 5–15)
BUN: 12 mg/dL (ref 6–20)
CO2: 23 mmol/L (ref 22–32)
Calcium: 8.8 mg/dL — ABNORMAL LOW (ref 8.9–10.3)
Chloride: 109 mmol/L (ref 98–111)
Creatinine, Ser: 1 mg/dL (ref 0.44–1.00)
GFR, Estimated: >60 mL/min (ref >60)
Glucose, Bld: 115 mg/dL — ABNORMAL HIGH (ref 70–99)
Potassium: 3.3 mmol/L — ABNORMAL LOW (ref 3.5–5.1)
Sodium: 140 mmol/L (ref 135–145)
Total Bilirubin: 0.7 mg/dL (ref 0.3–1.2)
Total Protein: 6.4 g/dL — ABNORMAL LOW (ref 6.5–8.1)

## 2021-10-14 LAB — APTT: aPTT: 29 seconds (ref 24–36)

## 2021-10-14 LAB — CBC WITH DIFFERENTIAL/PLATELET
Abs Immature Granulocytes: 0.08 10*3/uL — ABNORMAL HIGH (ref 0.00–0.07)
Basophils Absolute: 0 10*3/uL (ref 0.0–0.1)
Basophils Relative: 0 %
Eosinophils Absolute: 0.1 10*3/uL (ref 0.0–0.5)
Eosinophils Relative: 1 %
HCT: 26.9 % — ABNORMAL LOW (ref 36.0–46.0)
Hemoglobin: 9.2 g/dL — ABNORMAL LOW (ref 12.0–15.0)
Immature Granulocytes: 2 %
Lymphocytes Relative: 7 %
Lymphs Abs: 0.4 10*3/uL — ABNORMAL LOW (ref 0.7–4.0)
MCH: 26.9 pg (ref 26.0–34.0)
MCHC: 34.2 g/dL (ref 30.0–36.0)
MCV: 78.7 fL — ABNORMAL LOW (ref 80.0–100.0)
Monocytes Absolute: 0.2 10*3/uL (ref 0.1–1.0)
Monocytes Relative: 4 %
Neutro Abs: 4.2 10*3/uL (ref 1.7–7.7)
Neutrophils Relative %: 86 %
Platelets: 278 10*3/uL (ref 150–400)
RBC: 3.42 MIL/uL — ABNORMAL LOW (ref 3.87–5.11)
RDW: 15.5 % (ref 11.5–15.5)
WBC: 4.9 10*3/uL (ref 4.0–10.5)
nRBC: 0 % (ref 0.0–0.2)

## 2021-10-14 LAB — RESP PANEL BY RT-PCR (FLU A&B, COVID) ARPGX2
Influenza A by PCR: NEGATIVE
Influenza B by PCR: NEGATIVE
SARS Coronavirus 2 by RT PCR: NEGATIVE

## 2021-10-14 LAB — LACTIC ACID, PLASMA
Lactic Acid, Venous: 2.7 mmol/L (ref 0.5–1.9)
Lactic Acid, Venous: 2.2 mmol/L (ref 0.5–1.9)

## 2021-10-14 LAB — HIV ANTIBODY (ROUTINE TESTING W REFLEX): HIV Screen 4th Generation wRfx: NONREACTIVE

## 2021-10-14 LAB — PROTIME-INR
INR: 1 (ref 0.8–1.2)
Prothrombin Time: 13.5 seconds (ref 11.4–15.2)

## 2021-10-14 MED ORDER — LACTATED RINGERS IV BOLUS (SEPSIS)
1000.0000 mL | Freq: Once | INTRAVENOUS | Status: AC
Start: 2021-10-14 — End: 2021-10-14
  Administered 2021-10-14: 1000 mL via INTRAVENOUS

## 2021-10-14 MED ORDER — METRONIDAZOLE 500 MG/100ML IV SOLN
500.0000 mg | Freq: Once | INTRAVENOUS | Status: AC
Start: 2021-10-14 — End: 2021-10-14
  Administered 2021-10-14: 500 mg via INTRAVENOUS
  Filled 2021-10-14: qty 100

## 2021-10-14 MED ORDER — IBUPROFEN 200 MG PO TABS
400.0000 mg | ORAL_TABLET | Freq: Once | ORAL | Status: AC
  Administered 2021-10-14: 400 mg via ORAL
  Filled 2021-10-14: qty 2

## 2021-10-14 MED ORDER — HYDROCHLOROTHIAZIDE 25 MG PO TABS
25.0000 mg | ORAL_TABLET | Freq: Every day | ORAL | Status: DC
  Administered 2021-10-14: 25 mg via ORAL
  Filled 2021-10-14: qty 1

## 2021-10-14 MED ORDER — CALCIUM CARBONATE ANTACID 500 MG PO CHEW
400.0000 mg | CHEWABLE_TABLET | Freq: Two times a day (BID) | ORAL | Status: DC
  Administered 2021-10-14 – 2021-10-17 (×5): 400 mg via ORAL
  Filled 2021-10-14 (×6): qty 2

## 2021-10-14 MED ORDER — ASPIRIN 81 MG PO TBEC
81.0000 mg | DELAYED_RELEASE_TABLET | Freq: Two times a day (BID) | ORAL | Status: DC
  Administered 2021-10-14 – 2021-10-17 (×7): 81 mg via ORAL
  Filled 2021-10-14 (×7): qty 1

## 2021-10-14 MED ORDER — ONDANSETRON HCL 4 MG/2ML IJ SOLN: 4.0000 mg | Freq: Four times a day (QID) | INTRAMUSCULAR | Status: DC | PRN

## 2021-10-14 MED ORDER — ONDANSETRON HCL 4 MG PO TABS: 4.0000 mg | ORAL_TABLET | Freq: Four times a day (QID) | ORAL | Status: DC | PRN

## 2021-10-14 MED ORDER — POTASSIUM CHLORIDE 20 MEQ PO PACK
40.0000 meq | PACK | Freq: Once | ORAL | Status: AC
  Administered 2021-10-14: 40 meq via ORAL
  Filled 2021-10-14: qty 2

## 2021-10-14 MED ORDER — IRBESARTAN 150 MG PO TABS
150.0000 mg | ORAL_TABLET | Freq: Every day | ORAL | Status: DC
  Administered 2021-10-14: 150 mg via ORAL
  Filled 2021-10-14: qty 1

## 2021-10-14 MED ORDER — LACTATED RINGERS IV SOLN: INTRAVENOUS | Status: DC

## 2021-10-14 MED ORDER — ACETAMINOPHEN 650 MG RE SUPP: 650.0000 mg | Freq: Four times a day (QID) | RECTAL | Status: DC | PRN

## 2021-10-14 MED ORDER — VALSARTAN-HYDROCHLOROTHIAZIDE 160-25 MG PO TABS: 1.0000 | ORAL_TABLET | Freq: Every day | ORAL | Status: DC

## 2021-10-14 MED ORDER — LACTATED RINGERS IV BOLUS (SEPSIS): 1000.0000 mL | Freq: Once | INTRAVENOUS | Status: DC

## 2021-10-14 MED ORDER — MAGNESIUM HYDROXIDE 400 MG/5ML PO SUSP: 30.0000 mL | Freq: Every day | ORAL | Status: DC | PRN

## 2021-10-14 MED ORDER — POLYETHYLENE GLYCOL 3350 17 G PO PACK: 17.0000 g | PACK | Freq: Every day | ORAL | Status: DC | PRN

## 2021-10-14 MED ORDER — SODIUM CHLORIDE 0.9 % IV SOLN: 2.0000 g | Freq: Once | INTRAVENOUS | Status: DC

## 2021-10-14 MED ORDER — VANCOMYCIN HCL 1250 MG/250ML IV SOLN
1250.0000 mg | INTRAVENOUS | Status: DC
  Administered 2021-10-15 – 2021-10-16 (×2): 1250 mg via INTRAVENOUS
  Filled 2021-10-14 (×2): qty 250

## 2021-10-14 MED ORDER — VANCOMYCIN HCL 2000 MG/400ML IV SOLN
2000.0000 mg | Freq: Once | INTRAVENOUS | Status: AC
  Administered 2021-10-14: 2000 mg via INTRAVENOUS
  Filled 2021-10-14: qty 400

## 2021-10-14 MED ORDER — SODIUM CHLORIDE 0.9 % IV SOLN
2.0000 g | Freq: Once | INTRAVENOUS | Status: AC
  Administered 2021-10-14: 2 g via INTRAVENOUS
  Filled 2021-10-14: qty 12.5

## 2021-10-14 MED ORDER — APIXABAN 2.5 MG PO TABS
2.5000 mg | ORAL_TABLET | Freq: Two times a day (BID) | ORAL | Status: DC
  Filled 2021-10-14: qty 1

## 2021-10-14 MED ORDER — SENNA 8.6 MG PO TABS
2.0000 | ORAL_TABLET | Freq: Every day | ORAL | Status: DC
  Filled 2021-10-14 (×2): qty 2

## 2021-10-14 MED ORDER — ACETAMINOPHEN 500 MG PO TABS
1000.0000 mg | ORAL_TABLET | Freq: Three times a day (TID) | ORAL | Status: DC
Start: 2021-10-14 — End: 2021-10-14

## 2021-10-14 MED ORDER — METHOCARBAMOL 500 MG PO TABS
500.0000 mg | ORAL_TABLET | Freq: Four times a day (QID) | ORAL | Status: DC | PRN
Start: 2021-10-14 — End: 2021-10-17
  Administered 2021-10-14 – 2021-10-16 (×4): 500 mg via ORAL
  Filled 2021-10-14 (×4): qty 1

## 2021-10-14 MED ORDER — LACTATED RINGERS IV BOLUS (SEPSIS)
1000.0000 mL | Freq: Once | INTRAVENOUS | Status: AC
  Administered 2021-10-14: 1000 mL via INTRAVENOUS

## 2021-10-14 MED ORDER — IBUPROFEN 800 MG PO TABS
800.0000 mg | ORAL_TABLET | Freq: Once | ORAL | Status: AC
  Administered 2021-10-14: 800 mg via ORAL
  Filled 2021-10-14: qty 1

## 2021-10-14 MED ORDER — ONDANSETRON HCL 4 MG PO TABS: 4.0000 mg | ORAL_TABLET | Freq: Three times a day (TID) | ORAL | Status: DC | PRN

## 2021-10-14 MED ORDER — SODIUM CHLORIDE 0.9 % IV SOLN
2.0000 g | Freq: Three times a day (TID) | INTRAVENOUS | Status: DC
  Administered 2021-10-14 – 2021-10-16 (×6): 2 g via INTRAVENOUS
  Filled 2021-10-14 (×6): qty 12.5

## 2021-10-14 MED ORDER — VANCOMYCIN HCL IN DEXTROSE 1-5 GM/200ML-% IV SOLN: 1000.0000 mg | Freq: Once | INTRAVENOUS | Status: DC

## 2021-10-14 MED ORDER — ACETAMINOPHEN 325 MG PO TABS
650.0000 mg | ORAL_TABLET | Freq: Four times a day (QID) | ORAL | Status: DC | PRN
  Administered 2021-10-14: 650 mg via ORAL
  Filled 2021-10-14: qty 2

## 2021-10-14 MED ORDER — AMLODIPINE BESYLATE 5 MG PO TABS
5.0000 mg | ORAL_TABLET | Freq: Every day | ORAL | Status: DC
  Administered 2021-10-14 – 2021-10-16 (×3): 5 mg via ORAL
  Filled 2021-10-14 (×3): qty 1

## 2021-10-14 MED ORDER — CHLORPHENIRAMINE-ACETAMINOPHEN 2-325 MG PO TABS: 1.0000 | ORAL_TABLET | Freq: Every day | ORAL | Status: DC | PRN

## 2021-10-14 MED ORDER — ACETAMINOPHEN 500 MG PO TABS
1000.0000 mg | ORAL_TABLET | Freq: Three times a day (TID) | ORAL | Status: AC
  Administered 2021-10-15 (×3): 1000 mg via ORAL
  Filled 2021-10-14 (×3): qty 2

## 2021-10-14 MED ORDER — DOCUSATE SODIUM 100 MG PO CAPS
100.0000 mg | ORAL_CAPSULE | Freq: Two times a day (BID) | ORAL | Status: DC
  Filled 2021-10-14 (×4): qty 1

## 2021-10-14 MED ORDER — TRAZODONE HCL 50 MG PO TABS
25.0000 mg | ORAL_TABLET | Freq: Every evening | ORAL | Status: DC | PRN
  Administered 2021-10-15 – 2021-10-16 (×2): 25 mg via ORAL
  Filled 2021-10-14 (×2): qty 1

## 2021-10-14 MED ORDER — SODIUM CHLORIDE 0.9 % IV SOLN: INTRAVENOUS | Status: DC

## 2021-10-14 MED ORDER — METRONIDAZOLE 500 MG/100ML IV SOLN
500.0000 mg | Freq: Two times a day (BID) | INTRAVENOUS | Status: DC
  Administered 2021-10-14 – 2021-10-16 (×4): 500 mg via INTRAVENOUS
  Filled 2021-10-14 (×4): qty 100

## 2021-10-14 NOTE — Assessment & Plan Note (Addendum)
-   The patient has a SIRS as manifested by fever, tachycardia and tachypnea concerning for sepsis especially with elevated lactic acid. - She will be admitted to  telemetry bed. - We will continue broad-spectrum IV antibiotic therapy with IV vancomycin, cefepime and Flagyl. - Orthopedic consultation will be obtained.  Dr. Shon Baton believes that it is highly unlikely that it is related to her hip surgery. - We will follow blood cultures. - Her urinalysis and chest x-ray came back negative.

## 2021-10-14 NOTE — Assessment & Plan Note (Signed)
-   We will continue her antihypertensives. 

## 2021-10-14 NOTE — ED Triage Notes (Signed)
BIBA from home s/p total left hip replacement on Wednesday. Today started having fever/chills. T.max @ home 101.

## 2021-10-14 NOTE — Assessment & Plan Note (Addendum)
-   Pain management will be provided. - We will continue Eliquis and SCDs for DVT prophylaxis. - Orthopedic consultation follow-up will be obtained. - Dr. Shon Baton was notified about the patient

## 2021-10-14 NOTE — ED Notes (Signed)
Pt is shaking and reports she is freezing. I stated to pt that I cannot give her any blankets because of her temperature. She is also requesting more pain medicine.

## 2021-10-14 NOTE — Progress Notes (Signed)
Pharmacy Antibiotic Note  Kathy Harrison is a 51 y.o. female admitted on 10/14/2021 with sepsis, unknown source. Pharmacy has been consulted for Vancomycin and Cefepime dosing.  Plan: Vancomycin 2g IV x 1 given in the ED. Continue with Vancomycin 1250mg  IV q24h.  Vancomycin levels at steady state, as indicated. Cefepime 2g IV q8h. Metronidazole 500mg  IV q12h per MD. Monitor renal function, cultures, clinical course.   Height: 5\' 3"  (160 cm) Weight: 94 kg (207 lb 3.7 oz) IBW/kg (Calculated) : 52.4  Temp (24hrs), Avg:101.3 F (38.5 C), Min:99.4 F (37.4 C), Max:103.1 F (39.5 C)  Recent Labs  Lab 10/14/21 0245 10/14/21 0445  WBC 4.9  --   CREATININE 1.00  --   LATICACIDVEN 2.7* 2.2*    Estimated Creatinine Clearance: 70.9 mL/min (by C-G formula based on SCr of 1 mg/dL).    Allergies  Allergen Reactions   Aspirin Nausea And Vomiting    If not coated     Antimicrobials this admission: 7/7 Vancomycin >> 7/7 Cefepime >> 7/7 Metronidazole >>  Dose adjustments this admission: --  Microbiology results: 7/7 BCx:  7/7 UCx:   7/7 Resp panel: negative   Thank you for allowing pharmacy to be a part of this patient's care.   9/7, PharmD, BCPS Clinical Pharmacist  10/14/2021 7:41 AM

## 2021-10-14 NOTE — Sepsis Progress Note (Signed)
Elink following Code Sepsis. 

## 2021-10-14 NOTE — Progress Notes (Signed)
A consult was received from an ED physician for Vancomycin and Cefepime per pharmacy dosing.  The patient's profile has been reviewed for ht/wt/allergies/indication/available labs.    A one time order has been placed for Vancomycin 2gm IV and Cefepime 2gm IV.    Further antibiotics/pharmacy consults should be ordered by admitting physician if indicated.                       Thank you, Maryellen Pile, PharmD 10/14/2021  2:55 AM

## 2021-10-14 NOTE — Assessment & Plan Note (Signed)
repleted   Serial BMP 

## 2021-10-14 NOTE — H&P (Addendum)
[facility]   PATIENT NAME: Kathy Harrison    MR#:  [id number]  DATE OF BIRTH:  06-Aug-1970  DATE OF ADMISSION:  10/14/2021  PRIMARY CARE PHYSICIAN: Renaye Rakers, MD   Patient is coming from: Home  REQUESTING/REFERRING PHYSICIAN: Palumbo, April, MD  CHIEF COMPLAINT:   Chief Complaint  Patient presents with   Fever    HISTORY OF PRESENT ILLNESS:  Kathy Harrison is a 51 y.o. African-American female with medical history significant for essential hypertension, and osteoarthritis status post recent total left total hip arthroplasty on 6/28, who presented to the emergency room with acute onset of fever with a Tmax of 103.5 that started yesterday with associated chills.  She denies any nausea or vomiting or abdominal pain.  No chest pain or dyspnea or cough or wheezing.  No dysuria, oliguria or hematuria or flank pain.  She denies any abdominal pain or melena or bright red bleeding per rectum.  No other bleeding diathesis.  She denies any worsening left hip pain  ED Course: When she came to the ER, temperature was 103.1 BP was 144/78 with heart rate of 122 and respiratory to 24 and pulse oximetry approximately 100% on room air.  Labs revealed lactic acid of 2.7 and later 2.2 and hemoglobin 9.2 hematocrit 26.9 compared to 13.8 and 41.6 preoperatively.  Potassium 3.3 and AST 45 with total protein 6.4 and albumin 3.  UA was negative. EKG as reviewed by me : Showed sinus tachycardia with a rate of 117. Imaging: Two-view chest x-ray showed no acute cardiopulmonary disease.  The patient placed on broad-spectrum antibiotic coverage with IV vancomycin, cefepime and Flagyl and was given 2 L bolus of IV lactated Ringer followed by 150 mill per hour.  Dr. Shon Baton with orthopedic surgery was notified about the patient.  She will be admitted to a telemetry bed for further evaluation and management. PAST MEDICAL HISTORY:   Past Medical History:  Diagnosis Date   Abnormal Pap smear of cervix    neg HPV HR  16,18/45   Hypertension    PONV (postoperative nausea and vomiting)     PAST SURGICAL HISTORY:   Past Surgical History:  Procedure Laterality Date   CESAREAN SECTION  2009   REDUCTION MAMMAPLASTY Bilateral 2003   TOTAL HIP ARTHROPLASTY Left 10/05/2021   Procedure: TOTAL HIP ARTHROPLASTY ANTERIOR APPROACH;  Surgeon: Samson Frederic, MD;  Location: [facility];  Service: Orthopedics;  Laterality: Left;  150   WISDOM TOOTH EXTRACTION      SOCIAL HISTORY:   Social History   Tobacco Use   Smoking status: Never   Smokeless tobacco: Never  Substance Use Topics   Alcohol use: No    FAMILY HISTORY:   Family History  Problem Relation Age of Onset   Hypertension Mother    Stroke Mother    Hypertension Father    Other Sister 75       DEC MVA    DRUG ALLERGIES:   Allergies  Allergen Reactions   Aspirin Nausea And Vomiting    If not coated     REVIEW OF SYSTEMS:   ROS As per history of present illness. All pertinent systems were reviewed above. Constitutional, HEENT, cardiovascular, respiratory, GI, GU, musculoskeletal, neuro, psychiatric, endocrine, integumentary and hematologic systems were reviewed and are otherwise negative/unremarkable except for positive findings mentioned above in the HPI.   MEDICATIONS AT HOME:   Prior to Admission medications   Medication Sig Start Date End Date Taking? Authorizing  Provider  amLODipine (NORVASC) 5 MG tablet Take 5 mg by mouth at bedtime.    [provider]  apixaban (ELIQUIS) 2.5 MG TABS tablet Take 1 tablet (2.5 mg total) by mouth 2 (two) times daily. 10/05/21   Hill, Alain Honey, PA-C  Chlorpheniramine-APAP (CORICIDIN) 2-325 MG TABS Take 1 tablet by mouth daily as needed (congestion).    [provider]  docusate sodium (COLACE) 100 MG capsule Take 1 capsule (100 mg total) by mouth 2 (two) times daily. 10/05/21 11/04/21  Clois Dupes, PA-C  methocarbamol (ROBAXIN) 500 MG tablet Take 1 tablet (500 mg total) by mouth every  6 (six) hours as needed for muscle spasms. 10/05/21   Hill, Alain Honey, PA-C  ondansetron (ZOFRAN) 4 MG tablet Take 1 tablet (4 mg total) by mouth every 8 (eight) hours as needed for nausea or vomiting. 10/05/21 10/05/22  Clois Dupes, PA-C  polyethylene glycol (MIRALAX) 17 g packet Take 17 g by mouth daily as needed for mild constipation or moderate constipation. 10/05/21 11/04/21  Clois Dupes, PA-C  senna (SENOKOT) 8.6 MG TABS tablet Take 2 tablets (17.2 mg total) by mouth at bedtime for 15 days. 10/05/21 10/20/21  Clois Dupes, PA-C  valsartan-hydrochlorothiazide (DIOVAN-HCT) 160-25 MG tablet Take 1 tablet by mouth daily.    [provider]      VITAL SIGNS:  Blood pressure 116/68, pulse 90, temperature 99.4 F (37.4 C), temperature source Oral, resp. rate (!) 22, height 5\' 3"  (1.6 m), weight 94 kg, last menstrual period 07/09/2017, SpO2 96 %.  PHYSICAL EXAMINATION:  Physical Exam  GENERAL:  51 y.o.-year-old African-American patient lying in the bed with no acute distress.  EYES: Pupils equal, round, reactive to light and accommodation. No scleral icterus. Extraocular muscles intact.  HEENT: Head atraumatic, normocephalic. Oropharynx and nasopharynx clear.  NECK:  Supple, no jugular venous distention. No thyroid enlargement, no tenderness.  LUNGS: Normal breath sounds bilaterally, no wheezing, rales,rhonchi or crepitation. No use of accessory muscles of respiration.  CARDIOVASCULAR: Regular rate and rhythm, S1, S2 normal. No murmurs, rubs, or gallops.  ABDOMEN: Soft, nondistended, nontender. Bowel sounds present. No organomegaly or mass.  EXTREMITIES: No pedal edema, cyanosis, or clubbing.  NEUROLOGIC: Cranial nerves II through XII are intact. Muscle strength 5/5 in all extremities. Sensation intact. Gait not checked.  PSYCHIATRIC: The patient is alert and oriented x 3.  Normal affect and good eye contact. SKIN: Clean healing left hip wound.  No induration or warmth or tenderness.  No  obvious rash, lesion, or ulcer.   LABORATORY PANEL:   CBC Recent Labs  Lab 10/14/21 0245  WBC 4.9  HGB 9.2*  HCT 26.9*  PLT 278   ------------------------------------------------------------------------------------------------------------------  Chemistries  Recent Labs  Lab 10/14/21 0245  NA 140  K 3.3*  CL 109  CO2 23  GLUCOSE 115*  BUN 12  CREATININE 1.00  CALCIUM 8.8*  AST 45*  ALT 36  ALKPHOS 84  BILITOT 0.7   ------------------------------------------------------------------------------------------------------------------  Cardiac Enzymes No results for input(s): "TROPONINI" in the last 168 hours. ------------------------------------------------------------------------------------------------------------------  RADIOLOGY:  CT Hip Left Wo Contrast  Result Date: 10/14/2021 CLINICAL DATA:  Total left hip replacement 6/23 now with fever and chills. EXAM: CT OF THE LEFT HIP WITHOUT CONTRAST TECHNIQUE: Multidetector CT imaging of the left hip was performed according to the standard protocol. Multiplanar CT image reconstructions were also generated. RADIATION DOSE REDUCTION: This exam was performed according to the departmental dose-optimization program which includes automated exposure control,  adjustment of the mA and/or kV according to patient size and/or use of iterative reconstruction technique. COMPARISON:  Radiography 10/05/2021 FINDINGS: Bones/Joint/Cartilage Total left hip arthroplasty which is well seated and located. No acute fracture. Small volume medullary space gas below the femoral stem which could be related to the recent surgery. No detected joint effusion. Ligaments Suboptimally assessed by CT. Muscles and Tendons Soft tissue swelling and non organized fluid along the anterior incision. No evidence of discrete collection. No fascial gas. Soft tissues As above IMPRESSION: Total left hip arthroplasty with findings attributable to recent surgery. No acute finding  Electronically Signed   By: Tiburcio Pea M.D.   On: 10/14/2021 05:05   DG Chest 2 View  Result Date: 10/14/2021 CLINICAL DATA:  History of recent hip replacement with new fevers, initial encounter EXAM: CHEST - 2 VIEW COMPARISON:  01/16/2017 FINDINGS: Cardiac shadow is within normal limits. The lungs are well aerated bilaterally. No focal infiltrate or effusion is noted. No bony abnormality is noted. IMPRESSION: No active cardiopulmonary disease. Electronically Signed   By: Alcide Clever M.D.   On: 10/14/2021 03:40      IMPRESSION AND PLAN:  Assessment and Plan: * Sepsis (HCC) - The patient has a SIRS as manifested by fever, tachycardia and tachypnea concerning for sepsis especially with elevated lactic acid. - She will be admitted to  telemetry bed. - We will continue broad-spectrum IV antibiotic therapy with IV vancomycin, cefepime and Flagyl. - Orthopedic consultation will be obtained.  Dr. Shon Baton believes that it is highly unlikely that it is related to her hip surgery. - We will follow blood cultures. - Her urinalysis and chest x-ray came back negative.  Hypokalemia - Potassium will be replaced.  S/P total left hip arthroplasty - Pain management will be provided. - We will continue Eliquis and SCDs for DVT prophylaxis. - Orthopedic consultation follow-up will be obtained. - Dr. Shon Baton was notified about the patient  Hypertension - We will continue her antihypertensives    DVT prophylaxis: Eliquis. Advanced Care Planning:  Code Status: full code.  Family Communication:  The plan of care was discussed in details with the patient (and family). I answered all questions. The patient agreed to proceed with the above mentioned plan. Further management will depend upon hospital course. Disposition Plan: Back to previous home environment Consults called: Orthopedic consult follow-up. All the records are reviewed and case discussed with ED provider.  Status is: Inpatient    At  the time of the admission, it appears that the appropriate admission status for this patient is inpatient.  This is judged to be reasonable and necessary in order to provide the required intensity of service to ensure the patient's safety given the presenting symptoms, physical exam findings and initial radiographic and laboratory data in the context of comorbid conditions.  The patient requires inpatient status due to high intensity of service, high risk of further deterioration and high frequency of surveillance required.  I certify that at the time of admission, it is my clinical judgment that the patient will require inpatient hospital care extending more than 2 midnights.                            Dispo: The patient is from: Home              Anticipated d/c is to: Home              Patient currently is  not medically stable to d/c.              Difficult to place patient: No  Hannah Beat M.D on 10/14/2021 at 9:36 AM  [facility]   From 7 PM-7 AM, contact night-coverage [email or web address]  CC: Primary care physician; Renaye Rakers, MD

## 2021-10-14 NOTE — Progress Notes (Signed)
Seen examined and agree with POC  51 year old black female community dwelling grand multiparity G6 HTN, cough  Recent elective L hip surgery 6/28--d/c home but return 10/14/21 c fever Tmax 103.5-lactic acid 2.7, WBC 4  S Comfortable states no drainage from wound no cp dysuria cough diarr  o/e  BP 105/80   Pulse 96   Temp 99.4 F (37.4 C) (Oral)   Resp 19   Ht 5\' 3"  (1.6 m)   Wt 94 kg   LMP 07/09/2017 (Approximate)   SpO2 100%   BMI 36.71 kg/m  Middle age blk fem no distress, no ictno pallor Cta b no added sound Abd soft nt nd no rebound no guard Neuro intact no focal deficit Wound appears clean-dry  P Sepsis ? Casue--Ct hip just showed small amount of fluid /gas--Ortho Pa has seen--they do not believe this is infected Follow BC x 2 Continue empiric Abx Cefepime flagyl vanc--cont IVF 100 cc/h pain control ibuprofen and tylenol Follow blood cult performed  09/08/2017, MD [facility] 2:51 PM

## 2021-10-14 NOTE — Consult Note (Signed)
ORTHOPAEDIC CONSULTATION  REQUESTING PHYSICIAN: Rhetta Mura, MD  PCP:  Renaye Rakers, MD  Chief Complaint: Fever/chills  HPI: Kathy Harrison is a 51 y.o. female who presented to [facility] ED via ambulance due to fever and chills starting last night ranging from 99.0-101.0 at home. She recently had a left total hip replacement by Dr. [name] 10/05/21. She has been taking tylenol 1,000 mg q 8 hours and meloxicam at home for pain and swelling. She has stopped taking hydrocodone. She is taking aspirin 81mg  BID for DVT prophylaxis as she was unable to afford the Eliquis 2.5mg  BID, and she has been tolerating well without nausea and vomiting. She has been having mild pain and swelling in her left leg consistent with left THA. She denies any change or increase in her pain in her left hip since her surgery. She denies chest pain, shortness of breath, nausea, vomiting, calf pain/tenderness, changes in appetite.  No dysuria, oliguria or hematuria or flank pain.  She denies any abdominal pain or melena or bright red bleeding per rectum.   ED Course: When she came to the ER, temperature was 103.1 BP was 144/78 with heart rate of 122 and respiratory to 24 and pulse oximetry approximately 100% on room air.  Labs revealed lactic acid of 2.7 and later 2.2 and hemoglobin 9.2 hematocrit 26.9 compared to 13.8 and 41.6 preoperatively.  Potassium 3.3 and AST 45 with total protein 6.4 and albumin 3.  UA was negative. EKG as reviewed by me : Showed sinus tachycardia with a rate of 117. Imaging: Two-view chest x-ray showed no acute cardiopulmonary disease.  The patient placed on broad-spectrum antibiotic coverage with IV vancomycin, cefepime and Flagyl and was given 2 L bolus of IV lactated Ringer followed by 150 mill per hour.  Dr. with orthopedic surgery was notified about the patient who contacted Shon Baton PA-C for consult.   Past Medical History:  Diagnosis Date   Abnormal Pap smear of cervix    neg  HPV HR 16,18/45   Hypertension    PONV (postoperative nausea and vomiting)    Past Surgical History:  Procedure Laterality Date   CESAREAN SECTION  2009   REDUCTION MAMMAPLASTY Bilateral 2003   TOTAL HIP ARTHROPLASTY Left 10/05/2021   Procedure: TOTAL HIP ARTHROPLASTY ANTERIOR APPROACH;  Surgeon: 10/07/2021, MD;  Location: [facility];  Service: Orthopedics;  Laterality: Left;  150   WISDOM TOOTH EXTRACTION     Social History   Socioeconomic History   Marital status: Single    Spouse name: Not on file   Number of children: Not on file   Years of education: Not on file   Highest education level: Not on file  Occupational History   Not on file  Tobacco Use   Smoking status: Never   Smokeless tobacco: Never  Vaping Use   Vaping Use: Never used  Substance and Sexual Activity   Alcohol use: No   Drug use: No   Sexual activity: Not Currently    Partners: Male    Birth control/protection: Abstinence  Other Topics Concern   Not on file  Social History Narrative   Not on file   Social Determinants of Health   Financial Resource Strain: Not on file  Food Insecurity: Not on file  Transportation Needs: Not on file  Physical Activity: Not on file  Stress: Not on file  Social Connections: Not on file   Family History  Problem Relation Age of Onset  Hypertension Mother    Stroke Mother    Hypertension Father    Other Sister 49       DEC MVA   Allergies  Allergen Reactions   Aspirin Nausea And Vomiting    If not coated    Prior to Admission medications   Medication Sig Start Date End Date Taking? Authorizing Provider  acetaminophen (TYLENOL) 500 MG tablet Take 1,000 mg by mouth 2 (two) times daily as needed (pain).   Yes [provider]  amLODipine (NORVASC) 5 MG tablet Take 5 mg by mouth at bedtime.   Yes [provider]  [facility] ASPIRIN ADULT LOW DOSE 81 MG chewable tablet Chew 81 mg by mouth 2 (two) times daily. 10/05/21  Yes [provider]   esomeprazole (NEXIUM) 20 MG capsule Take 20 mg by mouth daily at 12 noon.   Yes [provider]  meloxicam (MOBIC) 15 MG tablet Take 15 mg by mouth daily. 10/07/21  Yes [provider]  methocarbamol (ROBAXIN) 500 MG tablet Take 1 tablet (500 mg total) by mouth every 6 (six) hours as needed for muscle spasms. 10/05/21  Yes Annaliyah Willig, Alain Honey, PA-C  valsartan-hydrochlorothiazide (DIOVAN-HCT) 160-25 MG tablet Take 1 tablet by mouth daily.   Yes [provider]  apixaban (ELIQUIS) 2.5 MG TABS tablet Take 1 tablet (2.5 mg total) by mouth 2 (two) times daily. Patient not taking: Reported on 10/14/2021 10/05/21   Clois Dupes, PA-C  Chlorpheniramine-APAP (CORICIDIN) 2-325 MG TABS Take 1 tablet by mouth daily as needed (congestion). Patient not taking: Reported on 10/14/2021    [provider]  docusate sodium (COLACE) 100 MG capsule Take 1 capsule (100 mg total) by mouth 2 (two) times daily. Patient not taking: Reported on 10/14/2021 10/05/21 11/04/21  Clois Dupes, PA-C  HYDROcodone-acetaminophen [facility]) 10-325 MG tablet Take 1 tablet by mouth every 4 (four) hours as needed (pain). Patient not taking: Reported on 10/14/2021    [provider]  ondansetron (ZOFRAN) 4 MG tablet Take 1 tablet (4 mg total) by mouth every 8 (eight) hours as needed for nausea or vomiting. Patient not taking: Reported on 10/14/2021 10/05/21 10/05/22  Clois Dupes, PA-C  polyethylene glycol (MIRALAX) 17 g packet Take 17 g by mouth daily as needed for mild constipation or moderate constipation. Patient not taking: Reported on 10/14/2021 10/05/21 11/04/21  Clois Dupes, PA-C  senna (SENOKOT) 8.6 MG TABS tablet Take 2 tablets (17.2 mg total) by mouth at bedtime for 15 days. Patient not taking: Reported on 10/14/2021 10/05/21 10/20/21  Clois Dupes, PA-C  traMADol (ULTRAM) 50 MG tablet Take 50 mg by mouth every 6 (six) hours as needed. Patient not taking: Reported on 10/14/2021    [provider]    CT Hip Left Wo Contrast  Result Date: 10/14/2021 CLINICAL DATA:  Total left hip replacement 6/23 now with fever and chills. EXAM: CT OF THE LEFT HIP WITHOUT CONTRAST TECHNIQUE: Multidetector CT imaging of the left hip was performed according to the standard protocol. Multiplanar CT image reconstructions were also generated. RADIATION DOSE REDUCTION: This exam was performed according to the departmental dose-optimization program which includes automated exposure control, adjustment of the mA and/or kV according to patient size and/or use of iterative reconstruction technique. COMPARISON:  Radiography 10/05/2021 FINDINGS: Bones/Joint/Cartilage Total left hip arthroplasty which is well seated and located. No acute fracture. Small volume medullary space gas below the femoral stem which could be related to the recent surgery. No detected joint  effusion. Ligaments Suboptimally assessed by CT. Muscles and Tendons Soft tissue swelling and non organized fluid along the anterior incision. No evidence of discrete collection. No fascial gas. Soft tissues As above IMPRESSION: Total left hip arthroplasty with findings attributable to recent surgery. No acute finding Electronically Signed   By: Tiburcio Pea M.D.   On: 10/14/2021 05:05   DG Chest 2 View  Result Date: 10/14/2021 CLINICAL DATA:  History of recent hip replacement with new fevers, initial encounter EXAM: CHEST - 2 VIEW COMPARISON:  01/16/2017 FINDINGS: Cardiac shadow is within normal limits. The lungs are well aerated bilaterally. No focal infiltrate or effusion is noted. No bony abnormality is noted. IMPRESSION: No active cardiopulmonary disease. Electronically Signed   By: Alcide Clever M.D.   On: 10/14/2021 03:40    Positive ROS: All other systems have been reviewed and were otherwise negative with the exception of those mentioned in the HPI and as above.  Physical Exam: General: Alert, no acute distress. Patient seems more fatigued than normal.   Cardiovascular: No significant pedal edema Respiratory: No cyanosis, no use of accessory musculature GI: No organomegaly, abdomen is soft and non-tender Psychiatric: Patient is competent for consent with normal mood and affect Lymphatic: No axillary or cervical lymphadenopathy  MUSCULOSKELETAL: Examination of the left hip reveals a benign incision. Her aquacel dressing was removed in the ED to view the incision. No erythema or drainage present. No wound dehiscence. No subcutaneous fluid collection. Swelling in the thigh present. She has mild tenderness to palpation of her thigh, she states that it is just sore and has been the same since surgery. Patient able to flex and rotate hip on her own.   Sensory and motor function intact in LE bilaterally. Distal pulses 2+ bilaterally. No significant pedal edema. Calves soft and non-tender.   New aquacel dressing applied over incision.   Assessment: Status post left total hip arthroplasty, 10/05/21. No evidence of infection or VTE. Sepsis protocol for fever and chills.   Plan: Patient under sepsis protocol and workup in the ED. She was found to have an elevated lactic acid 2.2 and 2.7. WBC count normal. Blood cultures pending. She was started on IV vancomycin and cefepime in the ED.She underwent a CT scan of her left hip that was benign and consistent with routine left total hip arthroplasty. No evidence of joint effusion seen on imaging nor any acute findings.  Her examination of the left hip was benign and no evidence of infection or VTE noted. New aquacel dressing applied over incision. From an orthopedic standpoint it doesn't seem likely that her fever and chills is coming from infection of her her recent total hip arthroplasty. Results are still pending to determine the cause of her recent fever and chills and abnormal results. Please re-consult orthopedics if needed.    Clois Dupes, PA-C    10/14/2021 10:15 AM

## 2021-10-14 NOTE — ED Provider Notes (Signed)
[facility]-EMERGENCY DEPT Provider Note   CSN: [id number] Arrival date & time: 10/14/21  0201     History  Chief Complaint  Patient presents with   Fever    Kathy Harrison is a 51 y.o. female.  The history is provided by the patient.  Fever Max temp prior to arrival:  103.1 Temp source:  Oral Severity:  Moderate Onset quality:  Gradual Duration:  1 day Timing:  Constant Progression:  Unchanged Chronicity:  New Relieved by:  Nothing Worsened by:  Nothing Ineffective treatments:  None tried Associated symptoms: chills   Associated symptoms: no chest pain, no confusion, no congestion, no cough, no dysuria, no nausea, no rash, no somnolence, no sore throat and no vomiting   Risk factors: no contaminated food   Risk factors comment:  Surgery 6/28      Home Medications Prior to Admission medications   Medication Sig Start Date End Date Taking? Authorizing Provider  amLODipine (NORVASC) 5 MG tablet Take 5 mg by mouth at bedtime.    [provider]  apixaban (ELIQUIS) 2.5 MG TABS tablet Take 1 tablet (2.5 mg total) by mouth 2 (two) times daily. 10/05/21   Hill, Alain Honey, PA-C  Chlorpheniramine-APAP (CORICIDIN) 2-325 MG TABS Take 1 tablet by mouth daily as needed (congestion).    [provider]  docusate sodium (COLACE) 100 MG capsule Take 1 capsule (100 mg total) by mouth 2 (two) times daily. 10/05/21 11/04/21  Clois Dupes, PA-C  methocarbamol (ROBAXIN) 500 MG tablet Take 1 tablet (500 mg total) by mouth every 6 (six) hours as needed for muscle spasms. 10/05/21   Hill, Alain Honey, PA-C  ondansetron (ZOFRAN) 4 MG tablet Take 1 tablet (4 mg total) by mouth every 8 (eight) hours as needed for nausea or vomiting. 10/05/21 10/05/22  Clois Dupes, PA-C  polyethylene glycol (MIRALAX) 17 g packet Take 17 g by mouth daily as needed for mild constipation or moderate constipation. 10/05/21 11/04/21  Clois Dupes, PA-C  senna (SENOKOT) 8.6 MG TABS tablet Take 2  tablets (17.2 mg total) by mouth at bedtime for 15 days. 10/05/21 10/20/21  Clois Dupes, PA-C  valsartan-hydrochlorothiazide (DIOVAN-HCT) 160-25 MG tablet Take 1 tablet by mouth daily.    [provider]      Allergies    Aspirin    Review of Systems   Review of Systems  Constitutional:  Positive for chills and fever.  HENT:  Negative for congestion and sore throat.   Eyes:  Negative for redness.  Respiratory:  Negative for cough.   Cardiovascular:  Negative for chest pain.  Gastrointestinal:  Negative for abdominal pain, nausea and vomiting.  Genitourinary:  Negative for dysuria.  Skin:  Negative for color change and rash.  Psychiatric/Behavioral:  Negative for confusion.   All other systems reviewed and are negative.   Physical Exam Updated Vital Signs BP (!) 158/92   Pulse (!) 118   Temp (!) 103.1 F (39.5 C) (Oral)   Resp (!) 23   Ht 5\' 3"  (1.6 m)   Wt 94 kg   LMP 07/09/2017 (Approximate)   SpO2 99%   BMI 36.71 kg/m  Physical Exam Vitals and nursing note reviewed. Exam conducted with a chaperone present.  Constitutional:      Appearance: Normal appearance. She is well-developed. She is not diaphoretic.  HENT:     Head: Normocephalic and atraumatic.     Nose: Nose normal.  Eyes:  Extraocular Movements: Extraocular movements intact.     Pupils: Pupils are equal, round, and reactive to light.  Cardiovascular:     Rate and Rhythm: Regular rhythm. Tachycardia present.     Pulses: Normal pulses.     Heart sounds: Normal heart sounds.  Pulmonary:     Effort: Pulmonary effort is normal. No respiratory distress.     Breath sounds: Normal breath sounds.  Abdominal:     General: Bowel sounds are normal. There is no distension.     Palpations: Abdomen is soft.     Tenderness: There is no abdominal tenderness. There is no guarding or rebound.  Genitourinary:    Vagina: No vaginal discharge.  Musculoskeletal:        General: Normal range of motion.      Cervical back: Neck supple.  Skin:    General: Skin is warm and dry.     Capillary Refill: Capillary refill takes less than 2 seconds.     Findings: No erythema or rash.       Neurological:     General: No focal deficit present.     Mental Status: She is alert and oriented to person, place, and time.     Deep Tendon Reflexes: Reflexes normal.  Psychiatric:        Mood and Affect: Mood normal.        Behavior: Behavior normal.     ED Results / Procedures / Treatments   Labs (all labs ordered are listed, but only abnormal results are displayed) Labs Reviewed  COMPREHENSIVE METABOLIC PANEL - Abnormal; Notable for the following components:      Result Value   Potassium 3.3 (*)    Glucose, Bld 115 (*)    Calcium 8.8 (*)    Total Protein 6.4 (*)    Albumin 3.0 (*)    AST 45 (*)    All other components within normal limits  LACTIC ACID, PLASMA - Abnormal; Notable for the following components:   Lactic Acid, Venous 2.7 (*)    All other components within normal limits  CBC WITH DIFFERENTIAL/PLATELET - Abnormal; Notable for the following components:   RBC 3.42 (*)    Hemoglobin 9.2 (*)    HCT 26.9 (*)    MCV 78.7 (*)    Lymphs Abs 0.4 (*)    Abs Immature Granulocytes 0.08 (*)    All other components within normal limits  LACTIC ACID, PLASMA - Abnormal; Notable for the following components:   Lactic Acid, Venous 2.2 (*)    All other components within normal limits  CULTURE, BLOOD (ROUTINE X 2)  CULTURE, BLOOD (ROUTINE X 2)  RESP PANEL BY RT-PCR (FLU A&B, COVID) ARPGX2  CULTURE, BLOOD (ROUTINE X 2)  CULTURE, BLOOD (ROUTINE X 2)  URINE CULTURE  PROTIME-INR  URINALYSIS, ROUTINE W REFLEX MICROSCOPIC  APTT  LACTIC ACID, PLASMA  LACTIC ACID, PLASMA  CBC WITH DIFFERENTIAL/PLATELET  HIV ANTIBODY (ROUTINE TESTING W REFLEX)  I-STAT BETA HCG BLOOD, ED ([facility], [facility], [facility] ONLY)    EKG None  Radiology CT Hip Left Wo Contrast  Result Date: 10/14/2021 CLINICAL DATA:  Total left hip  replacement 6/23 now with fever and chills. EXAM: CT OF THE LEFT HIP WITHOUT CONTRAST TECHNIQUE: Multidetector CT imaging of the left hip was performed according to the standard protocol. Multiplanar CT image reconstructions were also generated. RADIATION DOSE REDUCTION: This exam was performed according to the departmental dose-optimization program which includes automated exposure control, adjustment of the mA and/or kV  according to patient size and/or use of iterative reconstruction technique. COMPARISON:  Radiography 10/05/2021 FINDINGS: Bones/Joint/Cartilage Total left hip arthroplasty which is well seated and located. No acute fracture. Small volume medullary space gas below the femoral stem which could be related to the recent surgery. No detected joint effusion. Ligaments Suboptimally assessed by CT. Muscles and Tendons Soft tissue swelling and non organized fluid along the anterior incision. No evidence of discrete collection. No fascial gas. Soft tissues As above IMPRESSION: Total left hip arthroplasty with findings attributable to recent surgery. No acute finding Electronically Signed   By: Tiburcio Pea M.D.   On: 10/14/2021 05:05   DG Chest 2 View  Result Date: 10/14/2021 CLINICAL DATA:  History of recent hip replacement with new fevers, initial encounter EXAM: CHEST - 2 VIEW COMPARISON:  01/16/2017 FINDINGS: Cardiac shadow is within normal limits. The lungs are well aerated bilaterally. No focal infiltrate or effusion is noted. No bony abnormality is noted. IMPRESSION: No active cardiopulmonary disease. Electronically Signed   By: Alcide Clever M.D.   On: 10/14/2021 03:40    Procedures Procedures    Medications Ordered in ED Medications  lactated ringers infusion ( Intravenous New Bag/Given 10/14/21 0519)  lactated ringers bolus 1,000 mL (0 mLs Intravenous Stopped 10/14/21 0430)    And  lactated ringers bolus 1,000 mL (1,000 mLs Intravenous Not Given 10/14/21 0325)    And  lactated ringers  bolus 1,000 mL (0 mLs Intravenous Stopped 10/14/21 0430)  vancomycin (VANCOREADY) IVPB 2000 mg/400 mL (2,000 mg Intravenous New Bag/Given 10/14/21 0518)  amLODipine (NORVASC) tablet 5 mg (has no administration in time range)  valsartan-hydrochlorothiazide (DIOVAN-HCT) 160-25 MG per tablet 1 tablet (has no administration in time range)  docusate sodium (COLACE) capsule 100 mg (has no administration in time range)  ondansetron (ZOFRAN) tablet 4 mg (has no administration in time range)  polyethylene glycol (MIRALAX / GLYCOLAX) packet 17 g (has no administration in time range)  senna (SENOKOT) tablet 17.2 mg (has no administration in time range)  apixaban (ELIQUIS) tablet 2.5 mg (has no administration in time range)  methocarbamol (ROBAXIN) tablet 500 mg (has no administration in time range)  Chlorpheniramine-APAP 2-325 MG TABS 1 tablet (has no administration in time range)  0.9 %  sodium chloride infusion (has no administration in time range)  ceFEPIme (MAXIPIME) 2 g in sodium chloride 0.9 % 100 mL IVPB (has no administration in time range)  metroNIDAZOLE (FLAGYL) IVPB 500 mg (has no administration in time range)  vancomycin (VANCOCIN) IVPB 1000 mg/200 mL premix (has no administration in time range)  acetaminophen (TYLENOL) tablet 650 mg (has no administration in time range)    Or  acetaminophen (TYLENOL) suppository 650 mg (has no administration in time range)  traZODone (DESYREL) tablet 25 mg (has no administration in time range)  magnesium hydroxide (MILK OF MAGNESIA) suspension 30 mL (has no administration in time range)  ondansetron (ZOFRAN) tablet 4 mg (has no administration in time range)    Or  ondansetron (ZOFRAN) injection 4 mg (has no administration in time range)  ceFEPIme (MAXIPIME) 2 g in sodium chloride 0.9 % 100 mL IVPB (0 g Intravenous Stopped 10/14/21 0350)  metroNIDAZOLE (FLAGYL) IVPB 500 mg (0 mg Intravenous Stopped 10/14/21 0515)  ibuprofen (ADVIL) tablet 800 mg (800 mg Oral Given  10/14/21 0445)    ED Course/ Medical Decision Making/ A&P  Medical Decision Making Chills and fever for 24 hours. S/p hip replacement   Amount and/or Complexity of Data Reviewed Independent Historian: parent    Details: see above External Data Reviewed: notes.    Details: previous notes reviewed Labs: ordered.    Details: all labs reviewed: normal white count, low hemoglobin 9.2 and normal platelets.  Normal sodium and creatinine.  elevated AST 45.  Normal bilirubin.  elevated lactate 2.7.  Normal urine no UTI. Radiology: ordered and independent interpretation performed.    Details: chest negative for PNA. CT negative for abscess by me ECG/medicine tests: ordered and independent interpretation performed. Discussion of management or test interpretation with external provider(s): Case d/w Dr. Shon Baton of orthopedics the team will consult on the care of this patient   Risk Prescription drug management. Decision regarding hospitalization. Risk Details: Sepsis initiated immediately on arrival, multiple boluses   Critical Care Total time providing critical care: 60 minutes (Sepsis bundle and consultations )    Final Clinical Impression(s) / ED Diagnoses Final diagnoses:  Sepsis, due to unspecified organism, unspecified whether acute organ dysfunction present [facility])   The patient appears reasonably stabilized for admission considering the current resources, flow, and capabilities available in the ED at this time, and I doubt any other [facility] requiring further screening and/or treatment in the ED prior to admission.  Rx / DC Orders ED Discharge Orders     None         Feven Alderfer, MD 10/14/21 3825

## 2021-10-14 NOTE — Progress Notes (Signed)
Chaplain met with patient at bedside in Triage Room # 11 - met with family in lobby and escorted family members to and from room to see patient.  Idea was alert and exhibited signs of stress and worry - Chaplain provided spiritual care and comfor tot patient and her family throughout their visit.  Patient was attended to by staff and Chaplain prayed with patient and family.  Mother's faith and patient's faith are strong.  No emotional distress was exhibited at this time.  Will remain available as needed.    10/14/21 0300  Clinical Encounter Type  Visited With Patient;Patient and family together  Visit Type Initial;ED  Referral From Patient's clergy  Consult/Referral To Chaplain  Spiritual Encounters  Spiritual Needs Prayer;Emotional  Stress Factors  Patient Stress Factors Loss of control;Lack of knowledge  Family Stress Factors Loss of control

## 2021-10-15 DIAGNOSIS — A419 Sepsis, unspecified organism: Secondary | ICD-10-CM | POA: Diagnosis not present

## 2021-10-15 DIAGNOSIS — R6520 Severe sepsis without septic shock: Secondary | ICD-10-CM | POA: Diagnosis not present

## 2021-10-15 DIAGNOSIS — J9600 Acute respiratory failure, unspecified whether with hypoxia or hypercapnia: Secondary | ICD-10-CM | POA: Diagnosis not present

## 2021-10-15 LAB — CBC
HCT: 28 % — ABNORMAL LOW (ref 36.0–46.0)
Hemoglobin: 9.1 g/dL — ABNORMAL LOW (ref 12.0–15.0)
MCH: 25.9 pg — ABNORMAL LOW (ref 26.0–34.0)
MCHC: 32.5 g/dL (ref 30.0–36.0)
MCV: 79.8 fL — ABNORMAL LOW (ref 80.0–100.0)
Platelets: 297 10*3/uL (ref 150–400)
RBC: 3.51 MIL/uL — ABNORMAL LOW (ref 3.87–5.11)
RDW: 16 % — ABNORMAL HIGH (ref 11.5–15.5)
WBC: 4.4 10*3/uL (ref 4.0–10.5)
nRBC: 0 % (ref 0.0–0.2)

## 2021-10-15 LAB — BASIC METABOLIC PANEL
Anion gap: 9 (ref 5–15)
BUN: 12 mg/dL (ref 6–20)
CO2: 24 mmol/L (ref 22–32)
Calcium: 8.5 mg/dL — ABNORMAL LOW (ref 8.9–10.3)
Chloride: 109 mmol/L (ref 98–111)
Creatinine, Ser: 0.92 mg/dL (ref 0.44–1.00)
GFR, Estimated: >60 mL/min (ref >60)
Glucose, Bld: 137 mg/dL — ABNORMAL HIGH (ref 70–99)
Potassium: 3.2 mmol/L — ABNORMAL LOW (ref 3.5–5.1)
Sodium: 142 mmol/L (ref 135–145)

## 2021-10-15 LAB — PROTIME-INR
INR: 1.6 — ABNORMAL HIGH (ref 0.8–1.2)
Prothrombin Time: 18.8 seconds — ABNORMAL HIGH (ref 11.4–15.2)

## 2021-10-15 LAB — CORTISOL-AM, BLOOD: Cortisol - AM: 15.5 ug/dL (ref 6.7–22.6)

## 2021-10-15 LAB — URINE CULTURE
Culture: 10000 — AB
Special Requests: NORMAL

## 2021-10-15 LAB — PROCALCITONIN: Procalcitonin: 28.67 ng/mL

## 2021-10-15 MED ORDER — IBUPROFEN 200 MG PO TABS
400.0000 mg | ORAL_TABLET | Freq: Four times a day (QID) | ORAL | Status: DC
  Administered 2021-10-15 – 2021-10-17 (×9): 400 mg via ORAL
  Filled 2021-10-15 (×9): qty 2

## 2021-10-15 MED ORDER — POTASSIUM CHLORIDE CRYS ER 20 MEQ PO TBCR
40.0000 meq | EXTENDED_RELEASE_TABLET | Freq: Every day | ORAL | Status: DC
  Administered 2021-10-15 – 2021-10-16 (×2): 40 meq via ORAL
  Filled 2021-10-15 (×2): qty 2

## 2021-10-15 NOTE — Progress Notes (Signed)
PROGRESS NOTE   Kathy Harrison  TMA:[id number] DOB: [birth year] DOA: 10/14/2021 PCP: Renaye Rakers, MD  Brief Narrative:  51 year old black female community dwelling grand multiparity G6 HTN, cough   Recent elective L hip surgery 6/28--d/c home but return 10/14/21 c fever Tmax 103.5-lactic acid 2.7, WBC 4  Hospital-Problem based course  Sepsis query cause had lactic acidosis on admission, procalcitonin in the 20s  CT 7/7 does not really show any concern for infected hardware or abscess on the hip Follow blood culture urine culture to delineate source-continue broad coverage cefepime Flagyl vancomycin at this time hopefully can narrow in the next 24 to 48 hours Recent hip surgery 10/05/2021 Still having some pain-continue Tylenol 1000 every 8-we will give ibuprofen 400 4 times daily and watch creatinine carefully Was not able to afford the Eliquis so we will be taking aspirin 81 twice daily Hypotension with a history of hypertension Blood pressure slightly lower than usual this morning-continue only amlodipine 5 at bedtime-holding afebrile 150 HCTZ 25 Continue saline at 50 cc/H Mild hypokalemia Replace potassium with K. Dur  DVT prophylaxis: Lovenox Code Status: Full Family Communication: None present Disposition:  Status is: Inpatient Remains inpatient appropriate because:   Unclear source of fever and need to work-up   Consultants:  Orthopedics  Procedures: None  Antimicrobials: CT of hip   Subjective: Awake coherent-high-grade fever 104 last night--seems to have had a rigors while No diarrhea no cough no dysuria no vomiting  Objective: Vitals:   10/14/21 2223 10/14/21 2303 10/15/21 0448 10/15/21 0738  BP:  126/70 118/69 98/67  Pulse: 98 99 72 74  Resp: 20 16 18 16   Temp:  99.2 F (37.3 C) 97.8 F (36.6 C) 98.7 F (37.1 C)  TempSrc:  Oral Oral Oral  SpO2: 99% 98% 100% 100%  Weight:      Height:        Intake/Output Summary (Last 24 hours) at 10/15/2021 1145 Last  data filed at 10/15/2021 1009 Gross per 24 hour  Intake 420 ml  Output --  Net 420 ml   Filed Weights   10/14/21 0211  Weight: 94 kg    Examination:  EOMI NCAT no focal deficit Chest clinically clear no added sound rales rhonchi or wheeze Abdomen obese nontender no rebound no guarding ROM is grossly intact Neuro is grossly intact although slightly limited ROM to left lower extremity where surgery was done   Data Reviewed: personally reviewed   CBC    Component Value Date/Time   WBC 4.4 10/15/2021 1008   RBC 3.51 (L) 10/15/2021 1008   HGB 9.1 (L) 10/15/2021 1008   HCT 28.0 (L) 10/15/2021 1008   PLT 297 10/15/2021 1008   MCV 79.8 (L) 10/15/2021 1008   MCV 79.9 (A) 06/10/2015 1619   MCH 25.9 (L) 10/15/2021 1008   MCHC 32.5 10/15/2021 1008   RDW 16.0 (H) 10/15/2021 1008   LYMPHSABS 0.4 (L) 10/14/2021 0245   MONOABS 0.2 10/14/2021 0245   EOSABS 0.1 10/14/2021 0245   BASOSABS 0.0 10/14/2021 0245      Latest Ref Rng & Units 10/15/2021    8:39 AM 10/14/2021    2:45 AM 09/30/2021    9:49 AM  CMP  Glucose 70 - 99 mg/dL 10/02/2021  256  93   BUN 6 - 20 mg/dL 12  12  18    Creatinine 0.44 - 1.00 mg/dL 389   3.73   Sodium 135 - 145 mmol/L 142  140  140   Potassium  3.5 - 5.1 mmol/L 3.2  3.3  3.4   Chloride 98 - 111 mmol/L 109  109  104   CO2 22 - 32 mmol/L 24  23  27    Calcium 8.9 - 10.3 mg/dL 8.5  8.8  9.8   Total Protein 6.5 - 8.1 g/dL  6.4    Total Bilirubin 0.3 - 1.2 mg/dL  0.7    Alkaline Phos 38 - 126 U/L  84    AST 15 - 41 U/L  45    ALT 0 - 44 U/L  36       Radiology Studies: CT Hip Left Wo Contrast  Result Date: 10/14/2021 CLINICAL DATA:  Total left hip replacement 6/23 now with fever and chills. EXAM: CT OF THE LEFT HIP WITHOUT CONTRAST TECHNIQUE: Multidetector CT imaging of the left hip was performed according to the standard protocol. Multiplanar CT image reconstructions were also generated. RADIATION DOSE REDUCTION: This exam was performed according to the  departmental dose-optimization program which includes automated exposure control, adjustment of the mA and/or kV according to patient size and/or use of iterative reconstruction technique. COMPARISON:  Radiography 10/05/2021 FINDINGS: Bones/Joint/Cartilage Total left hip arthroplasty which is well seated and located. No acute fracture. Small volume medullary space gas below the femoral stem which could be related to the recent surgery. No detected joint effusion. Ligaments Suboptimally assessed by CT. Muscles and Tendons Soft tissue swelling and non organized fluid along the anterior incision. No evidence of discrete collection. No fascial gas. Soft tissues As above IMPRESSION: Total left hip arthroplasty with findings attributable to recent surgery. No acute finding Electronically Signed   By: 10/07/2021 M.D.   On: 10/14/2021 05:05   DG Chest 2 View  Result Date: 10/14/2021 CLINICAL DATA:  History of recent hip replacement with new fevers, initial encounter EXAM: CHEST - 2 VIEW COMPARISON:  01/16/2017 FINDINGS: Cardiac shadow is within normal limits. The lungs are well aerated bilaterally. No focal infiltrate or effusion is noted. No bony abnormality is noted. IMPRESSION: No active cardiopulmonary disease. Electronically Signed   By: 03/18/2017 M.D.   On: 10/14/2021 03:40     Scheduled Meds:  acetaminophen  1,000 mg Oral Q8H   amLODipine  5 mg Oral QHS   aspirin EC  81 mg Oral BID   calcium carbonate  400 mg of elemental calcium Oral BID   docusate sodium  100 mg Oral BID   irbesartan  150 mg Oral Daily   And   hydrochlorothiazide  25 mg Oral Daily   ibuprofen  400 mg Oral QID   senna  2 tablet Oral QHS   Continuous Infusions:  sodium chloride 50 mL/hr at 10/15/21 1042   ceFEPime (MAXIPIME) IV 2 g (10/15/21 1140)   lactated ringers     metronidazole 500 mg (10/15/21 0450)   vancomycin 1,250 mg (10/15/21 0625)     LOS: 1 day   Time spent: 47  31, MD [facility] To contact the attending provider between 7A-7P or the covering provider during after hours 7P-7A, please log into the web site [email or web address] and access using universal [place] password for that web site. If you do not have the password, please call the hospital operator.  10/15/2021, 11:45 AM

## 2021-10-15 NOTE — TOC Initial Note (Signed)
Transition of Care [facility]) - Initial/Assessment Note    Patient Details  Name: Kathy Harrison MRN: [id number] Date of Birth: May 01, 1970  Transition of Care [facility]) CM/SW Contact:    Golda Acre, RN Phone Number: 10/15/2021, 10:25 AM  Clinical Narrative:                  Transition of Care (TOC) Screening Note   Patient Details  Name: Kathy Harrison Date of Birth: 06-Nov-1970   Transition of Care [facility]) CM/SW Contact:    Golda Acre, RN Phone Number: 10/15/2021, 10:26 AM    Transition of Care Department [facility]) has reviewed patient and no TOC needs have been identified at this time. We will continue to monitor patient advancement through interdisciplinary progression rounds. If new patient transition needs arise, please place a TOC consult.    Expected Discharge Plan: Home/Self Care Barriers to Discharge: Continued Medical Work up   Patient Goals and CMS Choice Patient states their goals for this hospitalization and ongoing recovery are:: to go back home CMS Medicare.gov Compare Post Acute Care list provided to:: Patient    Expected Discharge Plan and Services Expected Discharge Plan: Home/Self Care   Discharge Planning Services: CM Consult   Living arrangements for the past 2 months: Single Family Home                                      Prior Living Arrangements/Services Living arrangements for the past 2 months: Single Family Home Lives with:: Self Patient language and need for interpreter reviewed:: Yes Do you feel safe going back to the place where you live?: Yes            Criminal Activity/Legal Involvement Pertinent to Current Situation/Hospitalization: No - Comment as needed  Activities of Daily Living Home Assistive Devices/Equipment: Eyeglasses ADL Screening (condition at time of admission) Patient's cognitive ability adequate to safely complete daily activities?: Yes Is the patient deaf or have difficulty hearing?: No Does the patient  have difficulty seeing, even when wearing glasses/contacts?: No Does the patient have difficulty concentrating, remembering, or making decisions?: No Patient able to express need for assistance with ADLs?: Yes Does the patient have difficulty dressing or bathing?: No Independently performs ADLs?: Yes (appropriate for developmental age) Does the patient have difficulty walking or climbing stairs?: No Weakness of Legs: None Weakness of Arms/Hands: None  Permission Sought/Granted                  Emotional Assessment Appearance:: Appears stated age     Orientation: : Oriented to Place, Oriented to Self, Oriented to  Time, Oriented to Situation Alcohol / Substance Use: Not Applicable Psych Involvement: No (comment)  Admission diagnosis:  Sepsis (HCC) [A41.9] Sepsis, due to unspecified organism, unspecified whether acute organ dysfunction present [facility]) [A41.9] Patient Active Problem List   Diagnosis Date Noted   Sepsis (HCC) 10/14/2021   Hypokalemia 10/14/2021   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 [place] [address] Phone: [phone] Fax: [phone]     Social Determinants of Health (SDOH) Interventions    Readmission Risk Interventions  No data to display

## 2021-10-16 DIAGNOSIS — A419 Sepsis, unspecified organism: Secondary | ICD-10-CM | POA: Diagnosis not present

## 2021-10-16 DIAGNOSIS — J9600 Acute respiratory failure, unspecified whether with hypoxia or hypercapnia: Secondary | ICD-10-CM | POA: Diagnosis not present

## 2021-10-16 DIAGNOSIS — R6520 Severe sepsis without septic shock: Secondary | ICD-10-CM | POA: Diagnosis not present

## 2021-10-16 LAB — BASIC METABOLIC PANEL
Anion gap: 8 (ref 5–15)
BUN: 9 mg/dL (ref 6–20)
CO2: 23 mmol/L (ref 22–32)
Calcium: 8.8 mg/dL — ABNORMAL LOW (ref 8.9–10.3)
Chloride: 110 mmol/L (ref 98–111)
Creatinine, Ser: 0.78 mg/dL (ref 0.44–1.00)
GFR, Estimated: >60 mL/min (ref >60)
Glucose, Bld: 98 mg/dL (ref 70–99)
Potassium: 4.1 mmol/L (ref 3.5–5.1)
Sodium: 141 mmol/L (ref 135–145)

## 2021-10-16 LAB — CBC WITH DIFFERENTIAL/PLATELET
Abs Immature Granulocytes: 0.07 10*3/uL (ref 0.00–0.07)
Basophils Absolute: 0 10*3/uL (ref 0.0–0.1)
Basophils Relative: 0 %
Eosinophils Absolute: 0.2 10*3/uL (ref 0.0–0.5)
Eosinophils Relative: 4 %
HCT: 27.1 % — ABNORMAL LOW (ref 36.0–46.0)
Hemoglobin: 8.9 g/dL — ABNORMAL LOW (ref 12.0–15.0)
Immature Granulocytes: 1 %
Lymphocytes Relative: 14 %
Lymphs Abs: 0.7 10*3/uL (ref 0.7–4.0)
MCH: 26.5 pg (ref 26.0–34.0)
MCHC: 32.8 g/dL (ref 30.0–36.0)
MCV: 80.7 fL (ref 80.0–100.0)
Monocytes Absolute: 0.6 10*3/uL (ref 0.1–1.0)
Monocytes Relative: 12 %
Neutro Abs: 3.8 10*3/uL (ref 1.7–7.7)
Neutrophils Relative %: 69 %
Platelets: 308 10*3/uL (ref 150–400)
RBC: 3.36 MIL/uL — ABNORMAL LOW (ref 3.87–5.11)
RDW: 16.1 % — ABNORMAL HIGH (ref 11.5–15.5)
WBC: 5.5 10*3/uL (ref 4.0–10.5)
nRBC: 0 % (ref 0.0–0.2)

## 2021-10-16 MED ORDER — SULFAMETHOXAZOLE-TRIMETHOPRIM 800-160 MG PO TABS
1.0000 | ORAL_TABLET | Freq: Two times a day (BID) | ORAL | Status: DC
Start: 2021-10-16 — End: 2021-10-17
  Administered 2021-10-16 – 2021-10-17 (×3): 1 via ORAL
  Filled 2021-10-16 (×3): qty 1

## 2021-10-16 MED ORDER — NITROFURANTOIN MACROCRYSTAL 100 MG PO CAPS
100.0000 mg | ORAL_CAPSULE | Freq: Two times a day (BID) | ORAL | Status: DC
  Filled 2021-10-16: qty 1

## 2021-10-16 NOTE — Progress Notes (Signed)
PROGRESS NOTE   Kathy Harrison  FMB:[id number] DOB: Dec 08, 1970 DOA: 10/14/2021 PCP: Renaye Rakers, MD  Brief Narrative:  51 year old black female community dwelling grand multiparity G6 HTN, cough   Recent elective L hip surgery 6/28--d/c home but return 10/14/21 c fever Tmax 103.5-lactic acid 2.7, WBC 4  Hospital-Problem based course  Sepsis query cause had lactic acidosis on admission, procalcitonin in the 20s  CT 7/7 does not really show any concern for infected hardware or abscess on the hip Blood culture X2 no growth  Urine culture-multiple CFU nothing specific-narrowing cefepime Flagyl vancomycin-->Bactrim DS-- if no further fever would complete a 7-day course of antibiotics and can probably discharge home in a.m. 7/10 Recent hip surgery 10/05/2021 Still having some pain-continue Tylenol 1000 every 8-continue ibuprofen 400 4 times daily and watch creatinine carefully Was not able to afford the Eliquis so we will be taking aspirin 81 twice daily Hypotension with a history of hypertension Blood pressure slightly lower than usual this morning-continue only amlodipine 5 at bedtime-holding avapro 150 HCTZ 25 Saline lock today Mild hypokalemia Replaced--.  Replacement  DVT prophylaxis: Lovenox Code Status: Full Family Communication: None present Disposition:  Status is: Inpatient Remains inpatient appropriate because:   Unclear source of fever and need to work-up   Consultants:  Orthopedics  Procedures: None  Antimicrobials: CT of hip   Subjective:  No further high-grade fevers feels fairly well moving some but does have some discomfort  Objective: Vitals:   10/15/21 0738 10/15/21 1507 10/15/21 2109 10/16/21 0425  BP: 98/67 109/73 115/82 126/79  Pulse: 74 86 85 84  Resp: 16 15 18 18   Temp: 98.7 F (37.1 C) 98.1 F (36.7 C) 98 F (36.7 C) 98.3 F (36.8 C)  TempSrc: Oral Oral Oral Oral  SpO2: 100% 100% 100% 97%  Weight:      Height:        Intake/Output Summary  (Last 24 hours) at 10/16/2021 0949 Last data filed at 10/15/2021 1009 Gross per 24 hour  Intake 220 ml  Output --  Net 220 ml    Filed Weights   10/14/21 0211  Weight: 94 kg    Examination:  awake coherent no distress EOMI NCAT no focal deficit Chest clear Abdomen soft no rebound Wound not examined ROM intact slightly antalgic S1-S2 no murmur Psych is euthymic  Data Reviewed: personally reviewed   CBC    Component Value Date/Time   WBC 5.5 10/16/2021 0638   RBC 3.36 (L) 10/16/2021 0638   HGB 8.9 (L) 10/16/2021 0638   HCT 27.1 (L) 10/16/2021 0638   PLT 308 10/16/2021 0638   MCV 80.7 10/16/2021 0638   MCV 79.9 (A) 06/10/2015 1619   MCH 26.5 10/16/2021 0638   MCHC 32.8 10/16/2021 0638   RDW 16.1 (H) 10/16/2021 0638   LYMPHSABS 0.7 10/16/2021 0638   MONOABS 0.6 10/16/2021 0638   EOSABS 0.2 10/16/2021 0638   BASOSABS 0.0 10/16/2021 0638      Latest Ref Rng & Units 10/16/2021    6:38 AM 10/15/2021    8:39 AM 10/14/2021    2:45 AM  CMP  Glucose 70 - 99 mg/dL 98  12/15/2021  701   BUN 6 - 20 mg/dL 9  12  12    Creatinine 0.44 - 1.00 mg/dL 779   3.90   Sodium 135 - 145 mmol/L 141  142  140   Potassium 3.5 - 5.1 mmol/L 4.1  3.2  3.3   Chloride 98 - 111 mmol/L 110  109  109   CO2 22 - 32 mmol/L 23  24  23    Calcium 8.9 - 10.3 mg/dL 8.8  8.5  8.8   Total Protein 6.5 - 8.1 g/dL   6.4   Total Bilirubin 0.3 - 1.2 mg/dL   0.7   Alkaline Phos 38 - 126 U/L   84   AST 15 - 41 U/L   45   ALT 0 - 44 U/L   36      Radiology Studies: No results found.   Scheduled Meds:  amLODipine  5 mg Oral QHS   aspirin EC  81 mg Oral BID   calcium carbonate  400 mg of elemental calcium Oral BID   docusate sodium  100 mg Oral BID   ibuprofen  400 mg Oral QID   potassium chloride  40 mEq Oral Daily   senna  2 tablet Oral QHS   sulfamethoxazole-trimethoprim  1 tablet Oral Q12H   Continuous Infusions:  lactated ringers       LOS: 2 days   Time spent: 28  30,  MD [facility] To contact the attending provider between 7A-7P or the covering provider during after hours 7P-7A, please log into the web site [email or web address] and access using universal [place] password for that web site. If you do not have the password, please call the hospital operator.  10/16/2021, 9:49 AM

## 2021-10-17 MED ORDER — IBUPROFEN 400 MG PO TABS: 400.0000 mg | ORAL_TABLET | Freq: Four times a day (QID) | ORAL | 0 refills | Status: DC

## 2021-10-17 MED ORDER — SULFAMETHOXAZOLE-TRIMETHOPRIM 800-160 MG PO TABS: 1.0000 | ORAL_TABLET | Freq: Two times a day (BID) | ORAL | 0 refills | Status: DC

## 2021-10-17 NOTE — Discharge Summary (Signed)
Physician Discharge Summary  Kathy Harrison CHY:[id number] DOB: 03-22-1971 DOA: 10/14/2021  PCP: Renaye Rakers, MD  Admit date: 10/14/2021 Discharge date: 10/17/2021  Time spent: 25 minutes  Recommendations for Outpatient Follow-up:  Complete Bactrim DS Needs screening labs ~ 1 week as OP Needs follow up with Dr. [name] for usual follow up  Discharge Diagnoses:  MAIN problem for hospitalization   Sepsis unclear cause on admit  Please see below for itemized issues addressed in HOpsital- refer to other progress notes for clarity if needed  Discharge Condition: improved   Diet recommendation: heart healthy  Filed Weights   10/14/21 0211  Weight: 94 kg    History of present illness:  51 year old black female community dwelling grand multiparity G6 HTN, cough   Recent elective L hip surgery 6/28--d/c home but return 10/14/21 c fever Tmax 103.5-lactic acid 2.7, WBC 4   Hospital-Problem based course   Sepsis query cause had lactic acidosis on admission, procalcitonin in the 20s  CT 7/7 does not really show any concern for infected hardware or abscess on the hip Blood culture X2 no growth  Urine culture-multiple CFU nothing specific-narrowing cefepime Flagyl vancomycin-->Bactrim DS-- complete a 7-day course of antibiotics and Rx called in for the same Recent hip surgery 10/05/2021 Still having some pain-continue Tylenol 1000 every 8 continue ibuprofen 400 4 times daily for pain For dvt prophylaxis asa 81 bid Hypotension with a history of hypertension Blood pressure slightly lower than usual this morning-continue only amlodipine 5 at bedtime- holding avapro 150 HCTZ 25--resume in 1-2 weeks Mild hypokalemia Replaced--.  Replaced   Discharge Exam: Vitals:   10/16/21 2055 10/17/21 0630  BP: 120/89 118/74  Pulse: 82 89  Resp: 16 18  Temp: 98.2 F (36.8 C) 99 F (37.2 C)  SpO2: 100% 99%    Subj on day of d/c   Awake coherent alert in nad no focal defciit Cta b no added  sound no rale no rhonchi no wheeze Abd soft nt dn no rebound no guard Neuro intact  Incision covered in dressing  Discharge Instructions   Discharge Instructions     Diet - low sodium heart healthy   Complete by: As directed    Discharge instructions   Complete by: As directed    Complete Bactrim Ds as directed--we didn't have a source for your infection but if u expereince severe chills, n,v or change in mental status u need to return immediately to the ED Follow with Dr. [name] for follow up   Increase activity slowly   Complete by: As directed    Leave dressing on - Keep it clean, dry, and intact until clinic visit   Complete by: As directed       Allergies as of 10/17/2021       Reactions   Aspirin Nausea And Vomiting   If not coated         Medication List     STOP taking these medications    apixaban 2.5 MG Tabs tablet Commonly known as: Eliquis   CORICIDIN 2-325 MG Tabs Generic drug: Chlorpheniramine-APAP   docusate sodium 100 MG capsule Commonly known as: Colace   esomeprazole 20 MG capsule Commonly known as: NEXIUM   HYDROcodone-acetaminophen 10-325 MG tablet Commonly known as: NORCO   meloxicam 15 MG tablet Commonly known as: MOBIC   valsartan-hydrochlorothiazide 160-25 MG tablet Commonly known as: DIOVAN-HCT       TAKE these medications    acetaminophen 500 MG tablet Commonly known as: TYLENOL  Take 1,000 mg by mouth 2 (two) times daily as needed (pain).   amLODipine 5 MG tablet Commonly known as: NORVASC Take 5 mg by mouth at bedtime.   [facility] Aspirin Adult Low Dose 81 MG chewable tablet Generic drug: aspirin Chew 81 mg by mouth 2 (two) times daily.   ibuprofen 400 MG tablet Commonly known as: ADVIL Take 1 tablet (400 mg total) by mouth 4 (four) times daily.   methocarbamol 500 MG tablet Commonly known as: ROBAXIN Take 1 tablet (500 mg total) by mouth every 6 (six) hours as needed for muscle spasms.   ondansetron 4 MG  tablet Commonly known as: Zofran Take 1 tablet (4 mg total) by mouth every 8 (eight) hours as needed for nausea or vomiting.   polyethylene glycol 17 g packet Commonly known as: MiraLax Take 17 g by mouth daily as needed for mild constipation or moderate constipation.   senna 8.6 MG Tabs tablet Commonly known as: SENOKOT Take 2 tablets (17.2 mg total) by mouth at bedtime for 15 days.   sulfamethoxazole-trimethoprim 800-160 MG tablet Commonly known as: BACTRIM DS Take 1 tablet by mouth every 12 (twelve) hours.   traMADol 50 MG tablet Commonly known as: ULTRAM Take 50 mg by mouth every 6 (six) hours as needed.               Discharge Care Instructions  (From admission, onward)           Start     Ordered   10/17/21 0000  Leave dressing on - Keep it clean, dry, and intact until clinic visit        10/17/21 1132           Allergies  Allergen Reactions   Aspirin Nausea And Vomiting    If not coated       The results of significant diagnostics from this hospitalization (including imaging, microbiology, ancillary and laboratory) are listed below for reference.    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 EXAM: CT OF THE LEFT HIP WITHOUT CONTRAST TECHNIQUE: Multidetector CT imaging of the left hip was performed according to the standard protocol. Multiplanar CT image reconstructions were also generated. RADIATION DOSE REDUCTION: This exam was performed according to the departmental dose-optimization program which includes automated exposure control, adjustment of the mA and/or kV according to patient size and/or use of iterative reconstruction technique. COMPARISON:  Radiography 10/05/2021 FINDINGS: Bones/Joint/Cartilage Total left hip arthroplasty which is well seated and located. No acute fracture. Small volume medullary space gas below the femoral stem  which could be related to the recent surgery. No detected joint effusion. Ligaments Suboptimally assessed by CT. Muscles and Tendons Soft tissue swelling and non organized fluid along the anterior incision. No evidence of discrete collection. No fascial gas. Soft tissues As above IMPRESSION: Total left hip arthroplasty with findings attributable to recent surgery. No acute finding Electronically Signed   By: Tiburcio PeaJonathan  Watts M.D.   On: 10/14/2021 05:05   DG Chest 2 View  Result Date: 10/14/2021 CLINICAL DATA:  History of recent hip replacement with new fevers, initial encounter EXAM: CHEST - 2 VIEW COMPARISON:  01/16/2017 FINDINGS: Cardiac shadow is within normal limits. The lungs are well aerated bilaterally. No focal infiltrate or effusion is noted. No bony abnormality is noted. IMPRESSION: No active cardiopulmonary disease. Electronically Signed   By: Alcide CleverMark  Lukens M.D.   On:  10/14/2021 03:40   DG Pelvis Portable  Result Date: 10/05/2021 CLINICAL DATA:  Left hip replacement EXAM: PORTABLE PELVIS 1-2 VIEWS COMPARISON:  08/02/2007 FINDINGS: Interval postsurgical changes from left total hip arthroplasty. Arthroplasty components are in their expected alignment. No periprosthetic fracture or evidence of other complication. Expected postoperative changes within the overlying soft tissues. IMPRESSION: Interval postsurgical changes from left total hip arthroplasty. Electronically Signed   By: Duanne Guess D.O.   On: 10/05/2021 11:40   DG HIP UNILAT WITH PELVIS 1V LEFT  Result Date: 10/05/2021 CLINICAL DATA:  51 year old female undergoing left hip arthroplasty. EXAM: DG HIP (WITH OR WITHOUT PELVIS) 1V*L* COMPARISON:  None Available. FINDINGS: Intraoperative fluoroscopic image demonstrates left total hip arthroplasty. No evidence of hardware loosening, fracture, or malalignment. IMPRESSION: Intraoperative fluoroscopic image of left total hip arthroplasty without complicating features. Electronically Signed    By: Marliss Coots M.D.   On: 10/05/2021 10:08   DG C-Arm 1-60 Min-No Report  Result Date: 10/05/2021 Fluoroscopy was utilized by the requesting physician.  No radiographic interpretation.   DG C-Arm 1-60 Min-No Report  Result Date: 10/05/2021 Fluoroscopy was utilized by the requesting physician.  No radiographic interpretation.    Microbiology: Recent Results (from the past 240 hour(s))  Culture, blood (Routine x 2)     Status: None (Preliminary result)   Collection Time: 10/14/21  2:45 AM   Specimen: BLOOD  Result Value Ref Range Status   Specimen Description   Final    BLOOD BLOOD LEFT WRIST Performed at [facility] [place] [facility], [address] [place], [place] [address]    Special Requests   Final    BOTTLES DRAWN AEROBIC AND ANAEROBIC Blood Culture adequate volume Performed at [facility], [address], [place], [place] [address]    Culture   Final    NO GROWTH 3 DAYS Performed at [facility], [address], [place], [place] [address]    Report Status PENDING  Incomplete  Culture, blood (Routine x 2)     Status: None (Preliminary result)   Collection Time: 10/14/21  2:55 AM   Specimen: BLOOD  Result Value Ref Range Status   Specimen Description   Final    BLOOD BLOOD RIGHT HAND Performed at [facility], [address] [place], [place] [address]    Special Requests   Final    BOTTLES DRAWN AEROBIC AND ANAEROBIC Blood Culture adequate volume Performed at [facility], [address], [place], [place] [address]    Culture   Final    NO GROWTH 3 DAYS Performed at [facility], [address], [place], [place] [address]    Report Status PENDING  Incomplete  Urine Culture     Status: Abnormal   Collection Time: 10/14/21  3:08 AM   Specimen: In/Out Cath Urine  Result Value Ref Range Status   Specimen Description   Final    IN/OUT CATH URINE Performed at [facility],  [address], [place], [place] [address]    Special Requests   Final    Normal Performed at [facility], [address] [place], [place] [address]    Culture (A)  Final    10,000 COLONIES/mL MULTIPLE SPECIES PRESENT, SUGGEST RECOLLECTION   Report Status 10/15/2021 FINAL  Final  Resp Panel by RT-PCR (Flu A&B, Covid)     Status: None   Collection Time: 10/14/21  5:23 AM   Specimen: Nasal Swab  Result Value Ref Range Status   SARS Coronavirus 2 by RT PCR NEGATIVE NEGATIVE Final    Comment: (NOTE) SARS-CoV-2 target nucleic acids are NOT DETECTED.  The SARS-CoV-2 RNA is generally detectable in upper respiratory specimens during the acute phase of infection. The lowest concentration of SARS-CoV-2 viral copies this assay can detect is 138 copies/mL. A negative result does not preclude SARS-Cov-2 infection and should not be used as the sole basis for treatment or other patient management decisions. A negative result may occur with  improper specimen collection/handling, submission of specimen other than nasopharyngeal swab, presence of viral mutation(s) within the areas targeted by this assay, and inadequate number of viral copies(<138 copies/mL). A negative result must be combined with clinical observations, patient history, and epidemiological information. The expected result is Negative.  Fact Sheet for Patients:  BloggerCourse.com  Fact Sheet for Healthcare Providers:  SeriousBroker.it  This test is no t yet approved or cleared by the [place] FDA and  has been authorized for detection and/or diagnosis of SARS-CoV-2 by FDA under an Emergency Use Authorization (EUA). This EUA will remain  in effect (meaning this test can be used) for the duration of the COVID-19 declaration under Section 564(b)(1) of the Act, 21 U.S.C.section 360bbb-3(b)(1), unless the authorization is terminated  or revoked sooner.        Influenza A by PCR NEGATIVE NEGATIVE Final   Influenza B by PCR NEGATIVE NEGATIVE Final    Comment: (NOTE) The Xpert Xpress SARS-CoV-2/FLU/RSV plus assay is intended as an aid in the diagnosis of influenza from Nasopharyngeal swab specimens and should not be used as a sole basis for treatment. Nasal washings and aspirates are unacceptable for Xpert Xpress SARS-CoV-2/FLU/RSV testing.  Fact Sheet for Patients: BloggerCourse.com  Fact Sheet for Healthcare Providers: SeriousBroker.it  This test is not yet approved or cleared by the [place] FDA and has been authorized for detection and/or diagnosis of SARS-CoV-2 by FDA under an Emergency Use Authorization (EUA). This EUA will remain in effect (meaning this test can be used) for the duration of the COVID-19 declaration under Section 564(b)(1) of the Act, 21 U.S.C. section 360bbb-3(b)(1), unless the authorization is terminated or revoked.  Performed at [facility], [address], [place], [place] [address]      Labs: Basic Metabolic Panel: Recent Labs  Lab 10/14/21 0245 10/15/21 0839 10/16/21 0638  NA 140 142 141  K 3.3* 3.2* 4.1  CL 109 109 110  CO2 23 24 23   GLUCOSE 115* 137* 98  BUN 12 12 9   CREATININE 1.00 0.92 0.78  CALCIUM 8.8* 8.5* 8.8*   Liver Function Tests: Recent Labs  Lab 10/14/21 0245  AST 45*  ALT 36  ALKPHOS 84  BILITOT 0.7  PROT 6.4*  ALBUMIN 3.0*   No results for input(s): "LIPASE", "AMYLASE" in the last 168 hours. No results for input(s): "AMMONIA" in the last 168 hours. CBC: Recent Labs  Lab 10/14/21 0245 10/15/21 1008 10/16/21 0638  WBC 4.9 4.4 5.5  NEUTROABS 4.2  --  3.8  HGB 9.2* 9.1* 8.9*  HCT 26.9* 28.0* 27.1*  MCV 78.7* 79.8* 80.7  PLT 278 297 308   Cardiac Enzymes: No results for input(s): "CKTOTAL", "CKMB", "CKMBINDEX", "TROPONINI" in the last 168 hours. BNP: BNP (last 3 results) No  results for input(s): "BNP" in the last 8760 hours.  ProBNP (last 3 results) No results for input(s): "PROBNP" in the last 8760 hours.  CBG: No results for input(s): "GLUCAP" in the  last 168 hours.     Signed:  Rhetta Mura MD   [facility] 10/17/2021, 11:32 AM

## 2021-10-19 LAB — CULTURE, BLOOD (ROUTINE X 2)
Culture: NO GROWTH
Culture: NO GROWTH
Special Requests: ADEQUATE
Special Requests: ADEQUATE

## 2022-09-26 ENCOUNTER — Other Ambulatory Visit: Payer: Self-pay | Admitting: Family Medicine

## 2022-09-26 DIAGNOSIS — Z1231 Encounter for screening mammogram for malignant neoplasm of breast: Secondary | ICD-10-CM

## 2022-10-17 ENCOUNTER — Ambulatory Visit
Admission: RE | Admit: 2022-10-17 | Discharge: 2022-10-17 | Disposition: A | Discharge status: Home / Self Care | Payer: Medicaid Other | Source: Ambulatory Visit | Attending: Family Medicine | Admitting: Family Medicine

## 2022-10-17 DIAGNOSIS — Z1231 Encounter for screening mammogram for malignant neoplasm of breast: Secondary | ICD-10-CM

## 2022-12-25 IMAGING — MG MM DIGITAL SCREENING BILAT W/ TOMO AND CAD
8 series · 8 of 24 positions shown · non-contrast
Comparison: Previous exam(s).

CLINICAL DATA: Screening.

EXAM:
DIGITAL SCREENING BILATERAL MAMMOGRAM WITH TOMOSYNTHESIS AND CAD
TECHNIQUE: Bilateral screening digital craniocaudal and mediolateral oblique
mammograms were obtained. Bilateral screening digital breast
tomosynthesis was performed. The images were evaluated with
computer-aided detection.

[L MLO synth-2D]
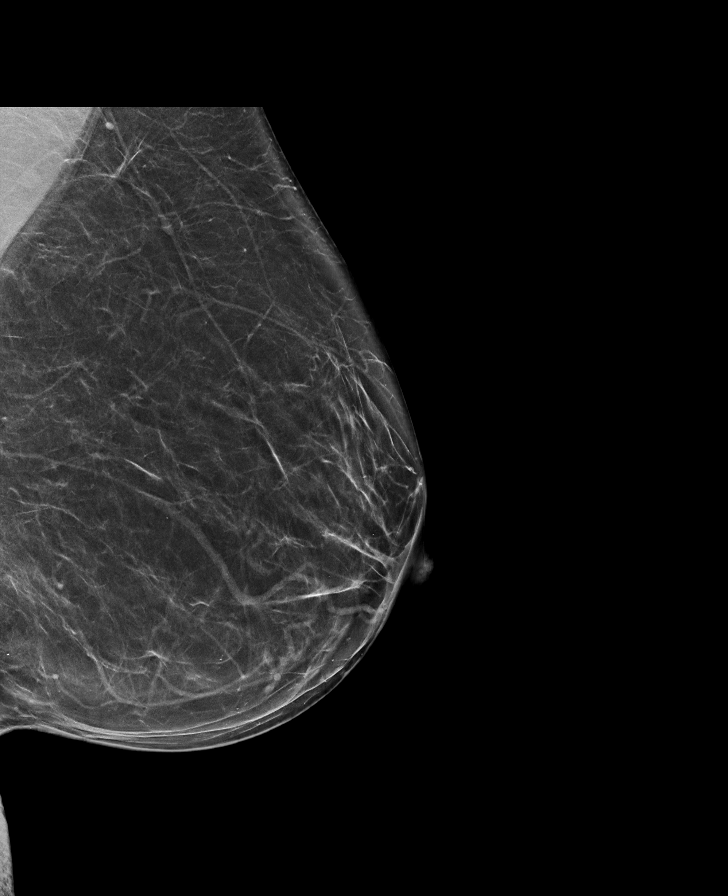

[L CC synth-2D]
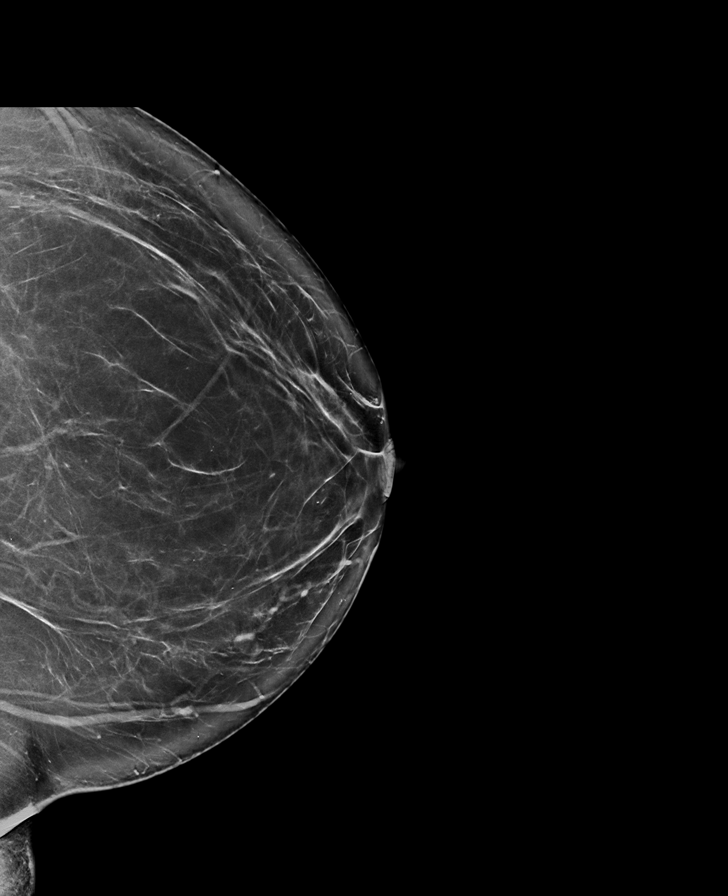

[R CC synth-2D]
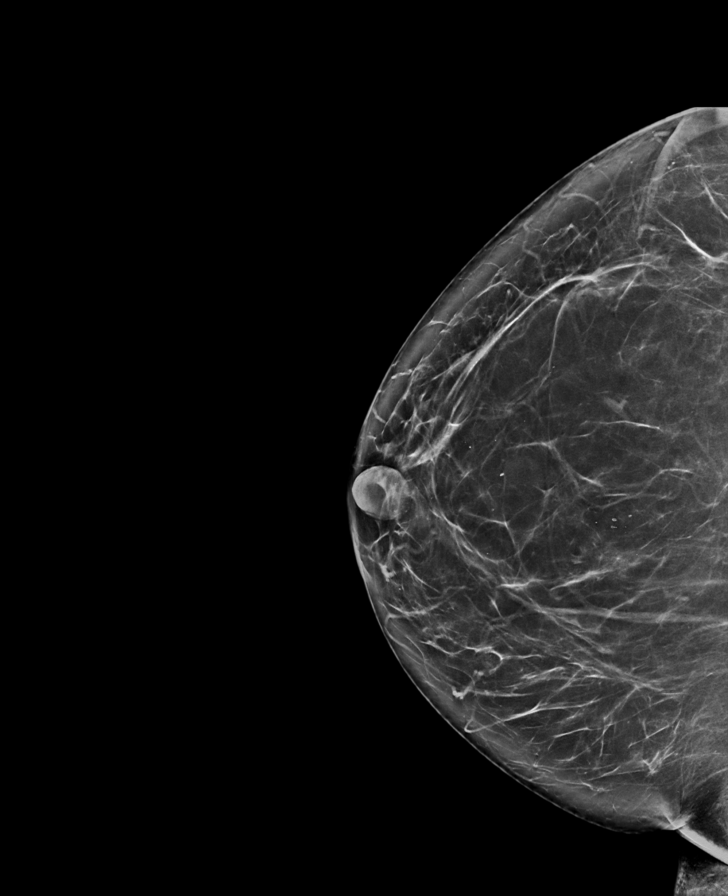

[R MLO synth-2D]
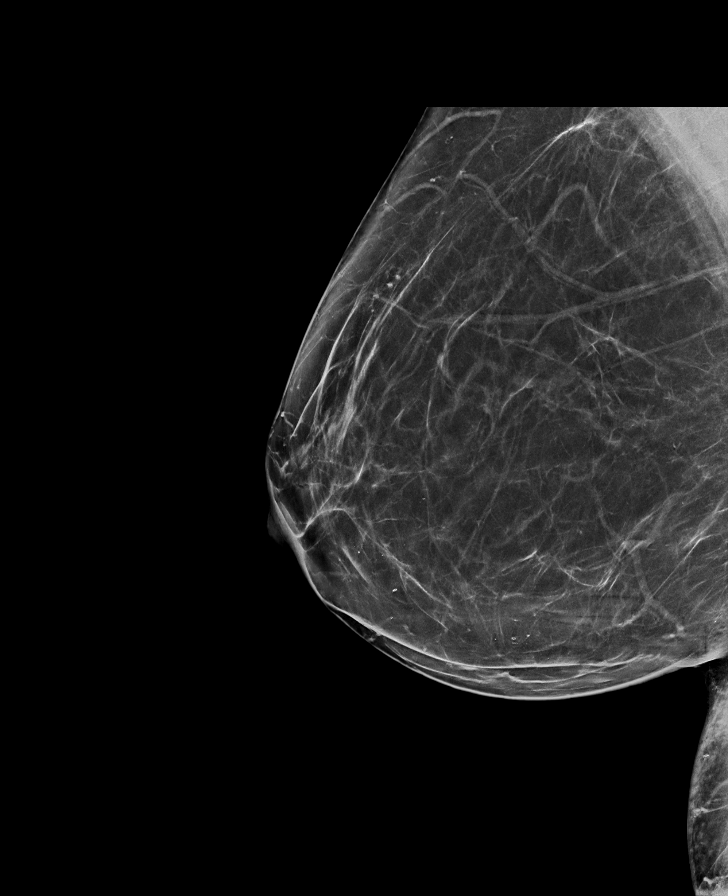

[L MLO tomo · tomo slice 41/82.0]
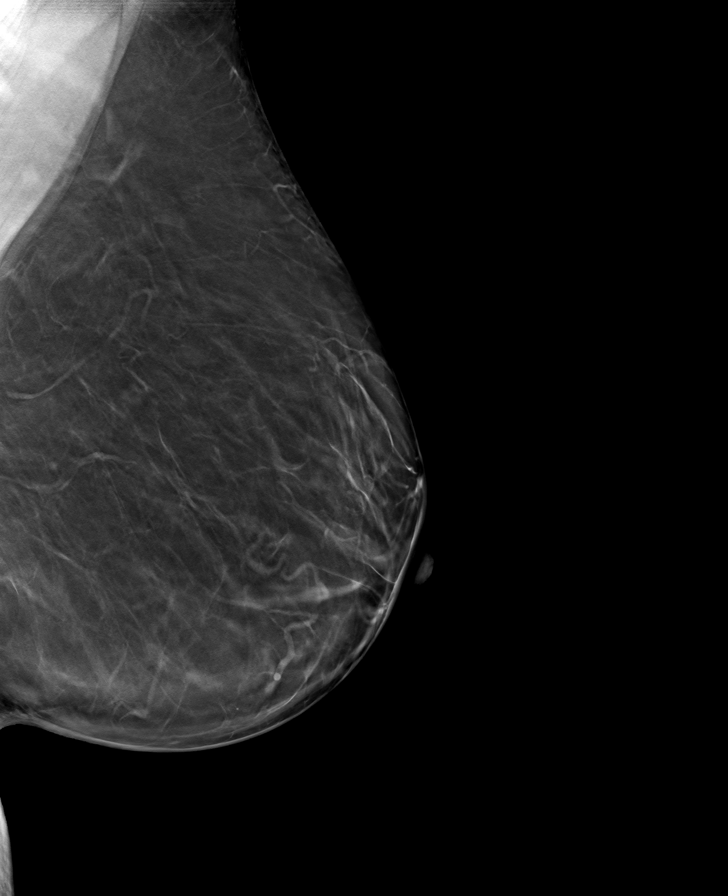

[R CC tomo · tomo slice 39/78.0]
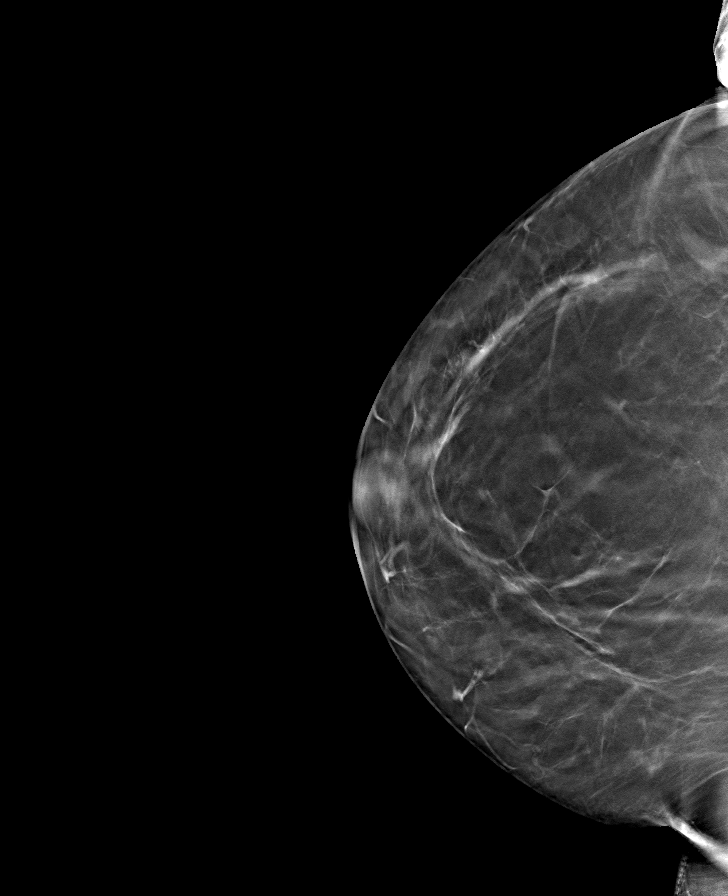

[L CC tomo · tomo slice 43/84.0]
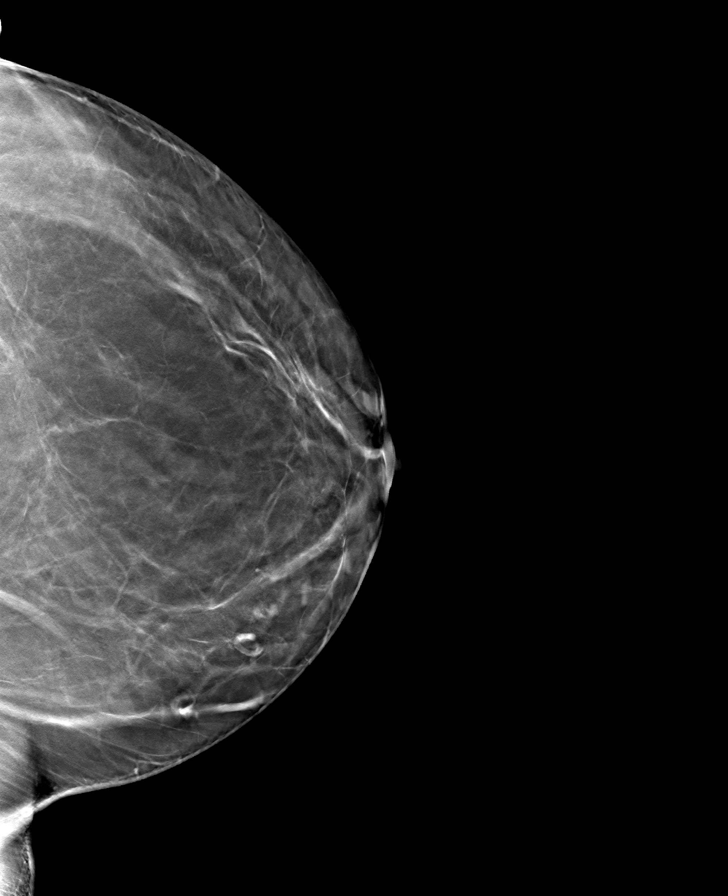

[R MLO tomo · tomo slice 43/85.0]
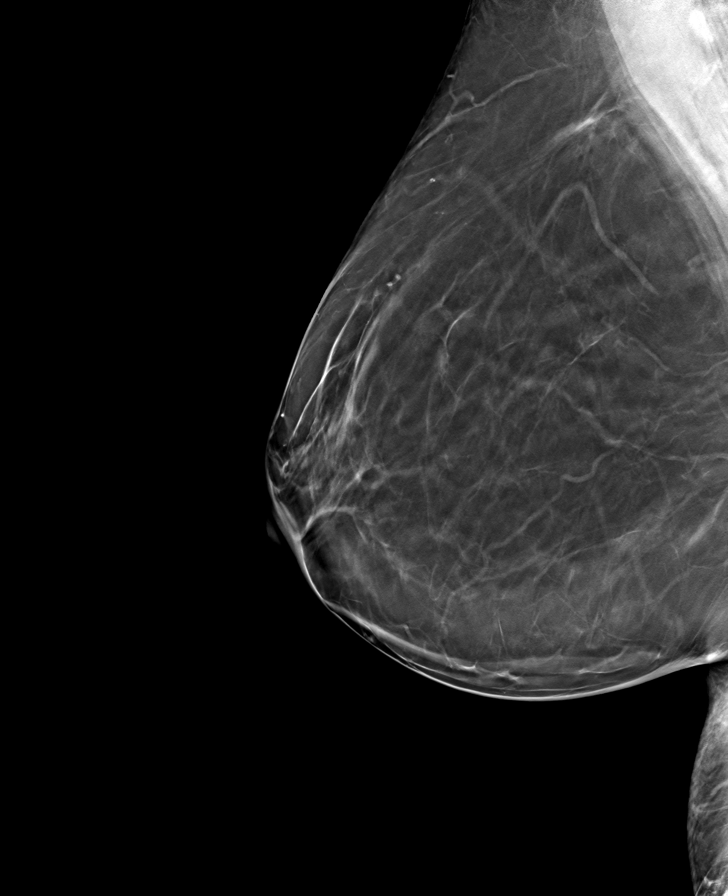

[8 of 24 positions shown; findings below may reference images not displayed]

ACR Breast Density Category b: There are scattered areas of
fibroglandular density.
FINDINGS: There are no findings suspicious for malignancy.
IMPRESSION: No mammographic evidence of malignancy. A result letter of this
screening mammogram will be mailed directly to the patient.

RECOMMENDATION:
Screening mammogram in one year. (Code:51-O-LD2)

BI-RADS CATEGORY  1: Negative.

## 2023-09-18 ENCOUNTER — Encounter: Payer: Self-pay | Admitting: Family

## 2023-09-18 NOTE — Progress Notes (Signed)
 "  New Patient Office Visit  Subjective:  Patient ID: Kathy Harrison, female    DOB: 06-29-1970  Age: 53 y.o. MRN: [id number]  CC:  Chief Complaint  Patient presents with   New Patient (Initial Visit)    Patient states no concerns to discuss.    HPI Kathy Harrison presents for establishing care today.  Discussed the use of AI scribe software for clinical note transcription with the patient, who gave verbal consent to proceed.  History of Present Illness Kathy Harrison is a 53 year old female with hypertension who presents for elevated blood pressure management.  She has previously been on valsartan  and amlodipine  for hypertension. Valsartan  was initially prescribed at 160mg  and later reduced to 80 mg once daily. She tolerated it well but discontinued due to hypotension. She also took hydrochlorothiazide  at 25mg  & reduced to 12.5mg  in combination with the valsartan . She reports intermittent ankle swelling. Her weight has increased by approximately 20 pounds since 2023, which may contribute to elevated blood pressure. She consumes at least two liters of water  daily. She reports she has experienced menopause for five years with symptoms of hot flashes and night sweats, which have improved but still occur occasionally. She has not had a gynecological exam since 2021.  Assessment & Plan Hypertension w/Obesity Blood pressure at 140/90 mmHg, above target. Weight gain may contribute. Sensitivity to medications in past noted. - Restart valsartan  80 mg with hydrochlorothiazide  12.5 mg once daily in the morning. - Encourage hydration with at least two liters of water  daily. - Monitor blood pressure at home and report significant changes, especially if lightheaded. - Schedule follow-up in 1-2 months to reassess blood pressure and medication efficacy. - Provide parameters for home blood pressure monitoring, and handout provided for more tips on controlling BP.  Postmenopause - Menopausal for five years with  occasional night sweats/hot flashes. No gynecological exams since 2021. - Recommend scheduling a Pap smear with HPV testing at physical.  General Health Maintenance Due for a physical examination with lab work. - Schedule a physical examination with lab work, ensuring fasting for at least eight hours prior.   Subjective:     Outpatient Medications Prior to Visit  Medication Sig Dispense Refill   acetaminophen  (TYLENOL ) 500 MG tablet Take 1,000 mg by mouth 2 (two) times daily as needed (pain). (Patient not taking: Reported on 09/19/2023)     amLODipine  (NORVASC ) 5 MG tablet Take 5 mg by mouth at bedtime. (Patient not taking: Reported on 09/19/2023)     [facility] ASPIRIN  ADULT LOW DOSE 81 MG chewable tablet Chew 81 mg by mouth 2 (two) times daily. (Patient not taking: Reported on 09/19/2023)     ibuprofen  (ADVIL ) 400 MG tablet Take 1 tablet (400 mg total) by mouth 4 (four) times daily. (Patient not taking: Reported on 09/19/2023) 30 tablet 0   methocarbamol  (ROBAXIN ) 500 MG tablet Take 1 tablet (500 mg total) by mouth every 6 (six) hours as needed for muscle spasms. (Patient not taking: Reported on 09/19/2023) 30 tablet 0   sulfamethoxazole -trimethoprim  (BACTRIM  DS) 800-160 MG tablet Take 1 tablet by mouth every 12 (twelve) hours. 6 tablet 0   traMADol  (ULTRAM ) 50 MG tablet Take 50 mg by mouth every 6 (six) hours as needed. (Patient not taking: Reported on 10/14/2021)     No facility-administered medications prior to visit.   Past Medical History:  Diagnosis Date   Abnormal Pap smear of cervix    neg HPV HR 16,18/45  Hypertension    Hypokalemia 10/14/2021   PONV (postoperative nausea and vomiting)    Sepsis (HCC) 10/14/2021   Past Surgical History:  Procedure Laterality Date   CESAREAN SECTION  2009   REDUCTION MAMMAPLASTY Bilateral 2003   TOTAL HIP ARTHROPLASTY Left 10/05/2021   Procedure: TOTAL HIP ARTHROPLASTY ANTERIOR APPROACH;  Surgeon: Fidel Rogue, MD;  Location: [facility];  Service:  Orthopedics;  Laterality: Left;  150   WISDOM TOOTH EXTRACTION      Objective:   Today's Vitals: BP (!) 140/90   Pulse 77   Temp (!) 97.4 F (36.3 C) (Temporal)   Ht 5' 3 (1.6 m)   Wt 220 lb 12.8 oz (100.2 kg)   LMP 07/09/2017 (Approximate)   SpO2 99%   BMI 39.11 kg/m   Physical Exam Vitals and nursing note reviewed.  Constitutional:      Appearance: Normal appearance. She is obese.  Cardiovascular:     Rate and Rhythm: Normal rate and regular rhythm.  Pulmonary:     Effort: Pulmonary effort is normal.     Breath sounds: Normal breath sounds.  Musculoskeletal:        General: Normal range of motion.  Skin:    General: Skin is warm and dry.  Neurological:     Mental Status: She is alert.  Psychiatric:        Mood and Affect: Mood normal.        Behavior: Behavior normal.    Meds ordered this encounter  Medications   valsartan -hydrochlorothiazide  (DIOVAN -HCT) 80-12.5 MG tablet    Sig: Take 1 tablet by mouth every morning.    Dispense:  90 tablet    Refill:  1    Supervising Provider:   ANDY, CAMILLE L [[id number]]    Lucius Krabbe, NP "

## 2023-09-19 ENCOUNTER — Ambulatory Visit (INDEPENDENT_AMBULATORY_CARE_PROVIDER_SITE_OTHER): Admitting: Family

## 2023-09-19 ENCOUNTER — Encounter: Payer: Self-pay | Admitting: Family

## 2023-09-19 VITALS — BP 140/90 | HR 77 | Temp 97.4°F | Ht 63.0 in | Wt 220.8 lb

## 2023-09-19 DIAGNOSIS — N959 Unspecified menopausal and perimenopausal disorder: Secondary | ICD-10-CM | POA: Insufficient documentation

## 2023-09-19 DIAGNOSIS — I10 Essential (primary) hypertension: Secondary | ICD-10-CM

## 2023-09-19 DIAGNOSIS — E66812 Obesity, class 2: Secondary | ICD-10-CM | POA: Diagnosis not present

## 2023-09-19 DIAGNOSIS — Z6839 Body mass index (BMI) 39.0-39.9, adult: Secondary | ICD-10-CM

## 2023-09-19 MED ORDER — VALSARTAN-HYDROCHLOROTHIAZIDE 80-12.5 MG PO TABS: 1.0000 | ORAL_TABLET | Freq: Every morning | ORAL | 1 refills | Status: DC

## 2023-09-19 NOTE — Assessment & Plan Note (Signed)
 Blood pressure at 140/90 mmHg, above target. Weight gain may contribute. Sensitivity to medications in past noted. - Restart valsartan  80 mg with hydrochlorothiazide  12.5 mg once daily in the morning. - Encourage hydration with at least two liters of water  daily. - Monitor blood pressure at home and report significant changes, especially if lightheaded. - Schedule follow-up in 1-2 months to reassess blood pressure and medication efficacy. - Provide parameters for home blood pressure monitoring.

## 2023-09-19 NOTE — Patient Instructions (Addendum)
 Welcome to [facility], It was a pleasure meeting you today!    As discussed, I have sent your Valsartan -HCTZ to your pharmacy. Start this tomorrow morning. Check BP at home, shooting for 110-120/60-80. Can let me know via MyChart if readings are below or staying above these numbers. Please schedule a 1-2 month follow up visit today and this can be combined with a physical & fasting labs.    PLEASE NOTE: If you had any LAB tests please let us  know if you have not heard back within a few days. You may see your results on MyChart before we have a chance to review them but we will give you a call once they are reviewed by us . If we ordered any REFERRALS today, please let us  know if you have not heard from their office within the next week.  Let us  know through MyChart if you are needing REFILLS, or have your pharmacy send us  the request. You can also use MyChart to communicate with me or any office staff.  Please try these tips to maintain a healthy lifestyle: It is important that you exercise regularly at least 30 minutes 5 times a week. Think about what you will eat, plan ahead. Choose whole foods, & think  clean, green, fresh or frozen over canned, processed or packaged foods which are more sugary, salty, and fatty. 70 to 75% of food eaten should be fresh vegetables and protein. 2-3  meals daily with healthy snacks between meals, but must be whole fruit, protein or vegetables. Aim to eat over a 10 hour period when you are active, for example, 7am to 5pm, and then STOP after your last meal of the day, drinking only water .  Shorter eating windows, 6-8 hours, are showing benefits in heart disease and blood sugar regulation. Drink water  every day! Shoot for 64 ounces daily = 8 cups, no other drink is as healthy! Fruit juice is best enjoyed in a healthy way, by EATING the fruit.

## 2023-10-04 ENCOUNTER — Encounter (HOSPITAL_COMMUNITY): Payer: Self-pay

## 2023-10-04 ENCOUNTER — Other Ambulatory Visit: Payer: Self-pay

## 2023-10-04 ENCOUNTER — Emergency Department (HOSPITAL_COMMUNITY)
Admission: EM | Admit: 2023-10-04 | Discharge: 2023-10-04 | Discharge status: Against Medical Advice | Attending: Emergency Medicine | Admitting: Emergency Medicine

## 2023-10-04 DIAGNOSIS — Z5321 Procedure and treatment not carried out due to patient leaving prior to being seen by health care provider: Secondary | ICD-10-CM | POA: Insufficient documentation

## 2023-10-04 DIAGNOSIS — R519 Headache, unspecified: Secondary | ICD-10-CM | POA: Diagnosis not present

## 2023-10-04 DIAGNOSIS — R110 Nausea: Secondary | ICD-10-CM | POA: Diagnosis present

## 2023-10-04 NOTE — ED Triage Notes (Signed)
 Pt coming in from home where she was working. Pt has nausea and brain fog causing her to call ems. Pt reporting headache upon arrival.  initial bp 190/110   Bp 164/90 Hr 76

## 2023-10-24 ENCOUNTER — Other Ambulatory Visit: Payer: Self-pay | Admitting: Family Medicine

## 2023-10-24 DIAGNOSIS — Z1231 Encounter for screening mammogram for malignant neoplasm of breast: Secondary | ICD-10-CM

## 2023-11-09 ENCOUNTER — Ambulatory Visit
Admission: RE | Admit: 2023-11-09 | Discharge: 2023-11-09 | Disposition: A | Discharge status: Home / Self Care | Source: Ambulatory Visit | Attending: Family Medicine | Admitting: Family Medicine

## 2023-11-09 DIAGNOSIS — Z1231 Encounter for screening mammogram for malignant neoplasm of breast: Secondary | ICD-10-CM

## 2023-11-20 ENCOUNTER — Ambulatory Visit (INDEPENDENT_AMBULATORY_CARE_PROVIDER_SITE_OTHER): Admitting: Family

## 2023-11-20 ENCOUNTER — Encounter: Payer: Self-pay | Admitting: Family

## 2023-11-20 VITALS — BP 137/90 | HR 81 | Temp 97.7°F | Ht 63.0 in | Wt 221.2 lb

## 2023-11-20 DIAGNOSIS — I10 Essential (primary) hypertension: Secondary | ICD-10-CM

## 2023-11-20 DIAGNOSIS — Z13220 Encounter for screening for lipoid disorders: Secondary | ICD-10-CM | POA: Diagnosis not present

## 2023-11-20 DIAGNOSIS — Z0000 Encounter for general adult medical examination without abnormal findings: Secondary | ICD-10-CM | POA: Diagnosis not present

## 2023-11-20 LAB — COMPREHENSIVE METABOLIC PANEL WITH GFR
ALT: 14 U/L (ref 0–35)
AST: 18 U/L (ref 0–37)
Albumin: 4.1 g/dL (ref 3.5–5.2)
Alkaline Phosphatase: 60 U/L (ref 39–117)
BUN: 12 mg/dL (ref 6–23)
CO2: 27 meq/L (ref 19–32)
Calcium: 9.3 mg/dL (ref 8.4–10.5)
Chloride: 101 meq/L (ref 96–112)
Creatinine, Ser: 0.7 mg/dL (ref 0.40–1.20)
GFR: 97.29 mL/min (ref >60.00)
Glucose, Bld: 89 mg/dL (ref 70–99)
Potassium: 4 meq/L (ref 3.5–5.1)
Sodium: 139 meq/L (ref 135–145)
Total Bilirubin: 0.4 mg/dL (ref 0.2–1.2)
Total Protein: 7.5 g/dL (ref 6.0–8.3)

## 2023-11-20 LAB — TSH: TSH: 1.24 u[IU]/mL (ref 0.35–5.50)

## 2023-11-20 LAB — CBC WITH DIFFERENTIAL/PLATELET
Basophils Absolute: 0 K/uL (ref 0.0–0.1)
Basophils Relative: 0.5 % (ref 0.0–3.0)
Eosinophils Absolute: 0.3 K/uL (ref 0.0–0.7)
Eosinophils Relative: 5.8 % — ABNORMAL HIGH (ref 0.0–5.0)
HCT: 41.5 % (ref 36.0–46.0)
Hemoglobin: 13.5 g/dL (ref 12.0–15.0)
Lymphocytes Relative: 21.4 % (ref 12.0–46.0)
Lymphs Abs: 1 K/uL (ref 0.7–4.0)
MCHC: 32.7 g/dL (ref 30.0–36.0)
MCV: 78.9 fl (ref 78.0–100.0)
Monocytes Absolute: 0.4 K/uL (ref 0.1–1.0)
Monocytes Relative: 9.4 % (ref 3.0–12.0)
Neutro Abs: 3 K/uL (ref 1.4–7.7)
Neutrophils Relative %: 62.9 % (ref 43.0–77.0)
Platelets: 265 K/uL (ref 150.0–400.0)
RBC: 5.26 Mil/uL — ABNORMAL HIGH (ref 3.87–5.11)
RDW: 16.3 % — ABNORMAL HIGH (ref 11.5–15.5)
WBC: 4.7 K/uL (ref 4.0–10.5)

## 2023-11-20 LAB — LIPID PANEL
Cholesterol: 245 mg/dL — ABNORMAL HIGH (ref 0–200)
HDL: 61.3 mg/dL (ref >39.00)
LDL Cholesterol: 127 mg/dL — ABNORMAL HIGH (ref 0–99)
NonHDL: 183.58
Total CHOL/HDL Ratio: 4
Triglycerides: 282 mg/dL — ABNORMAL HIGH (ref 0.0–149.0)
VLDL: 56.4 mg/dL — ABNORMAL HIGH (ref 0.0–40.0)

## 2023-11-20 NOTE — Patient Instructions (Addendum)
 It was very nice to see you today!   I will review your lab results via MyChart in a few days.  Keep taking the Valsartan -HCTZ every morning.  Please schedule a 4 month follow up visit today.  Try to increase your exercise to at least 20 minutes of fast walking as many days as able!  For good bowels: Magnesium  glycinate, L-threonate, or taurate can help with anxiety, maintaining sleep (if taken at night), muscle recovery, good bowel function, hot flashes, and maintaining blood pressure. Look for chelated form (better absorbability) and for any over the counter supplement or vitamin, look for organic or has a 3rd party seal from NSF international, [employer] or USP. This authenticates the quality but not the efficacy (since not FDA approved).        PLEASE NOTE:  If you had any lab tests please let us  know if you have not heard back within a few days. You may see your results on MyChart before we have a chance to review them but we will give you a call once they are reviewed by us . If we ordered any referrals today, please let us  know if you have not heard from their office within the next week.

## 2023-11-20 NOTE — Assessment & Plan Note (Signed)
 Hypertension controlled with valsartan  and hydrochlorothiazide . Home readings within acceptable range. - Continue valsartan - hydrochlorothiazide  80-12.5mg  daily. - No refill needed today - F/U in 4 months

## 2023-11-20 NOTE — Progress Notes (Signed)
 Phone [phone]  Subjective:   Patient is a 53 y.o. female presenting for annual physical.    Chief Complaint  Patient presents with   Annual Exam    Fasting w/ labs   Hypertension  Discussed the use of AI scribe software for clinical note transcription with the patient, who gave verbal consent to proceed.  History of Present Illness Birgit D Vallin is a 53 year old female who presents for an annual physical exam.  Hypertension and cardiovascular symptoms - Takes valsartan  and hydrochlorothiazide  daily - Home blood pressure readings are satisfactory - History of heart palpitations, but no recent episodes - No dizziness or fatigue  Gastrointestinal function - Maintains regular bowel movements - Focuses on hydration and fiber intake  Lifestyle and psychosocial adjustment - Non-smoker - Abstains from alcohol  - Adjusting to a new neighborhood - Considering increasing physical activity - Recently experienced the loss of her dog which has affected her routine and not walking as much  Cancer screening and preventive care - Recent mammogram was normal - Colonoscopy performed in 2020; next recommended in ten years  See problem oriented charting- ROS- full  review of systems was completed and negative except for what is noted in HPI above.  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   Essential (primary) hypertension 08/21/2015   BMI  39.0-39.9,adult 08/21/2015   Past Surgical History:  Procedure Laterality Date   CESAREAN SECTION  2009   REDUCTION MAMMAPLASTY Bilateral 2003   TOTAL HIP ARTHROPLASTY Left 10/05/2021   Procedure: TOTAL HIP ARTHROPLASTY ANTERIOR APPROACH;  Surgeon: Fidel Rogue, MD;  Location: [facility];  Service: Orthopedics;  Laterality: Left;  150   WISDOM TOOTH EXTRACTION      Family History  Problem Relation Age of Onset   Hypertension Mother    Stroke Mother    Hypertension Father    Hypertension Sister    Other Sister 16       DEC MVA    Medications- reviewed and updated Current Outpatient Medications  Medication Sig Dispense Refill   valsartan -hydrochlorothiazide  (DIOVAN -HCT) 80-12.5 MG tablet Take 1 tablet by mouth every morning. 90 tablet 1   No current facility-administered medications for this visit.    Allergies-reviewed and updated Allergies  Allergen Reactions   Aspirin  Nausea And Vomiting    If not coated     Social History   Social History Narrative   Not on file    Objective:  BP (!) 138/91 (BP Location: Left Arm, Patient Position: Sitting, Cuff Size: Large)   Pulse 81   Temp 97.7 F (36.5 C) (Temporal)   Ht 5' 3 (1.6 m)   Wt 221 lb 4 oz (100.4 kg)   LMP 07/09/2017 (Approximate)   SpO2 100%   BMI 39.19 kg/m  Physical Exam Vitals and nursing note reviewed.  Constitutional:      Appearance: Normal appearance.  HENT:     Head: Normocephalic.  Right Ear: Tympanic membrane normal.     Left Ear: Tympanic membrane normal.     Nose: Nose normal.     Mouth/Throat:     Mouth: Mucous membranes are moist.  Eyes:     Pupils: Pupils are equal, round, and reactive to light.  Cardiovascular:     Rate and Rhythm: Normal rate and regular rhythm.  Pulmonary:     Effort: Pulmonary effort is normal.     Breath sounds: Normal breath sounds.  Musculoskeletal:        General: Normal range of motion.     Cervical back: Normal range of motion.  Lymphadenopathy:      Cervical: No cervical adenopathy.  Skin:    General: Skin is warm and dry.  Neurological:     Mental Status: She is alert.  Psychiatric:        Mood and Affect: Mood normal.        Behavior: Behavior normal.      Assessment and Plan   Health Maintenance counseling: 1. Anticipatory guidance: Patient counseled regarding regular dental exams q6 months, eye exams,  avoiding smoking and second hand smoke, limiting alcohol  to 1 beverage per day, no illicit drugs.   2. Risk factor reduction:  Advised patient of need for regular exercise and diet rich with fruits and vegetables to reduce risk of heart attack and stroke.  Wt Readings from Last 3 Encounters:  11/20/23 221 lb 4 oz (100.4 kg)  10/04/23 220 lb (99.8 kg)  09/19/23 220 lb 12.8 oz (100.2 kg)   3. Immunizations/screenings/ancillary studies Immunization History  Administered Date(s) Administered   PPD Test 05/13/2019   Tdap 04/10/2009   Health Maintenance Due  Topic Date Due   Hepatitis B Vaccines (1 of 3 - 19+ 3-dose series) Never done   Pneumococcal Vaccine: 50+ Years (1 of 1 - PCV) Never done   DTaP/Tdap/Td (2 - Td or Tdap) 04/11/2019   COVID-19 Vaccine (1 - 2024-25 season) Never done   INFLUENZA VACCINE  11/09/2023    4. Cervical cancer screening- due 2026 5. Breast cancer screening-  mammogram done, normal 6. Colon cancer screening - done 2020, due 2030 7. Skin cancer screening- advised regular sunscreen use. Denies worrisome, changing, or new skin lesions.  8. Birth control/STD check- None 9. Osteoporosis screening- N/A  10. Alcohol  screening: none 11. Smoking associated screening (lung cancer screening, AAA screen 65-75, UA)- never smoked  12. Exercise-  walking, but has not done recently  Assessment & Plan Adult Wellness Visit Routine wellness visit. Blood pressure controlled. Mammogram normal. Colonoscopy due in 2030. No need for STD or osteoporosis screening. No substance use. Regular bowel movements. No  dental or significant cardiac issues. - Perform physical examination. - Order full blood work panel: cholesterol, glucose, electrolytes, renal function, liver function, calcium , protein, complete blood count, thyroid  function. - Provide preventative health tips on AVS. - Encourage regular exercise to increase heart rate. - Advise on dietary fiber intake and hydration for good bowel health daily. - Recommend magnesium  supplement for bowel health and stress management.  Essential hypertension Hypertension controlled with valsartan  and hydrochlorothiazide . Home readings within acceptable range. - Continue valsartan - hydrochlorothiazide  80-12.5mg  daily. - No refill needed today - F/U in 4 months   Recommended follow up:  Return in about 4 months (around 03/21/2024) for HTN, med refills. No future appointments.  Lab/Order associations: fasting   Lucius Krabbe, NP

## 2023-11-22 ENCOUNTER — Ambulatory Visit: Payer: Self-pay | Admitting: Family

## 2023-11-23 ENCOUNTER — Telehealth: Payer: Self-pay | Admitting: Family

## 2023-11-23 DIAGNOSIS — E782 Mixed hyperlipidemia: Secondary | ICD-10-CM

## 2023-11-23 NOTE — Telephone Encounter (Unsigned)
 Copied from CRM [id number]. Topic: Clinical - Medication Question >> Nov 23, 2023 10:58 AM Mia F wrote: Reason for CRM: Pt says Np Corean reached out to her regarding her recent labs. She mentioned starting pt on a Cholesterol medication. Pt says she would like to move forward with the medication but was not given a name for it. Please send medication to: [id number] [address], [place] - [address] AT [facility] [address] [place] [address] Phone: [phone] Fax: [phone]  Please contact pt if needed or when rx has been sent. Pt is available by phone or My Chart

## 2023-11-26 DIAGNOSIS — E782 Mixed hyperlipidemia: Secondary | ICD-10-CM | POA: Insufficient documentation

## 2023-11-26 MED ORDER — ROSUVASTATIN CALCIUM 5 MG PO TABS: 5.0000 mg | ORAL_TABLET | ORAL | 1 refills | Status: DC

## 2023-11-26 NOTE — Telephone Encounter (Signed)
 generic crestor  sent with titration instructions on the bottle, thanks.

## 2023-11-26 NOTE — Telephone Encounter (Signed)
 I called pt and lvm in regards to Rx Crestor . This has been sent into patient's pharmacy, Advised pt to give us  a call back if she has any questions or concerns.

## 2023-11-28 ENCOUNTER — Other Ambulatory Visit: Payer: Self-pay

## 2023-11-28 ENCOUNTER — Encounter (HOSPITAL_COMMUNITY): Payer: Self-pay | Admitting: *Deleted

## 2023-11-28 ENCOUNTER — Ambulatory Visit (HOSPITAL_COMMUNITY)
Admission: EM | Admit: 2023-11-28 | Discharge: 2023-11-28 | Disposition: A | Discharge status: Home / Self Care | Attending: Family Medicine | Admitting: Family Medicine

## 2023-11-28 DIAGNOSIS — J069 Acute upper respiratory infection, unspecified: Secondary | ICD-10-CM | POA: Diagnosis not present

## 2023-11-28 DIAGNOSIS — J029 Acute pharyngitis, unspecified: Secondary | ICD-10-CM | POA: Diagnosis not present

## 2023-11-28 LAB — POCT RAPID STREP A (OFFICE): Rapid Strep A Screen: NEGATIVE

## 2023-11-28 MED ORDER — PROMETHAZINE-DM 6.25-15 MG/5ML PO SYRP: 5.0000 mL | ORAL_SOLUTION | Freq: Four times a day (QID) | ORAL | 0 refills | Status: AC | PRN

## 2023-11-28 NOTE — ED Triage Notes (Signed)
 PT reports Lt ear pain for one day and her throat hurt starting on Monday. Pt also has a cough.

## 2023-11-28 NOTE — ED Provider Notes (Signed)
 [facility]   [id number] 11/28/23 Arrival Time: 1446  ASSESSMENT & PLAN:  1. Viral URI with cough   2. Sore throat    Discussed typical duration of likely viral illness. Results for orders placed or performed during the hospital encounter of 11/28/23  POCT rapid strep A   Collection Time: 11/28/23  3:46 PM  Result Value Ref Range   Rapid Strep A Screen Negative Negative   OTC symptom care as needed. Work note provided.  Meds ordered this encounter  Medications   promethazine -dextromethorphan (PROMETHAZINE -DM) 6.25-15 MG/5ML syrup    Sig: Take 5 mLs by mouth 4 (four) times daily as needed for cough.    Dispense:  118 mL    Refill:  0     Follow-up Information     Lucius Krabbe, NP.   Specialty: Family Medicine Why: As needed. Contact information: [address] [place] [address] [phone]         [facility] at [facility].   Specialty: Urgent Care Why: If worsening or failing to improve as anticipated. Contact information: [address] [place]  [address] [phone]                Reviewed expectations re: course of current medical issues. Questions answered. Outlined signs and symptoms indicating need for more acute intervention. Understanding verbalized. After Visit Summary given.   SUBJECTIVE: History from: Patient. Kathy Harrison is a 53 y.o. female. PT reports Lt ear pain for one day and her throat hurt starting on Monday. Pt also has a cough.  Hoarse voice now. Denies: fever and difficulty breathing. Normal PO intake without n/v/d.  OBJECTIVE:  Vitals:   11/28/23 1530  BP: 127/87  Pulse: 84  Resp: 20  Temp: 98.4 F (36.9 C)  SpO2: 100%    General appearance: alert; no distress Eyes: PERRLA; EOMI; conjunctiva normal HENT: [address] [place]; AT; with nasal congestion; throat with irritation/cobblestoning Neck: supple  Lungs: speaks full sentences without difficulty;  unlabored Extremities: no edema Skin: warm and dry Neurologic: normal gait Psychological: alert and cooperative; normal mood and affect  Labs: Results for orders placed or performed during the hospital encounter of 11/28/23  POCT rapid strep A   Collection Time: 11/28/23  3:46 PM  Result Value Ref Range   Rapid Strep A Screen Negative Negative   Labs Reviewed  POCT RAPID STREP A (OFFICE)    Imaging: No results found.  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   Sepsis (HCC) 10/14/2021   Social History   Socioeconomic History   Marital status: Single    Spouse name: Not on file   Number of children: Not on file   Years of education: Not on file   Highest education level: Bachelor's degree (e.g., BA, AB, BS)  Occupational History   Not on file  Tobacco Use   Smoking status: Never   Smokeless tobacco: Never  Vaping Use   Vaping status: Never Used  Substance and Sexual Activity   Alcohol  use: No   Drug use: No   Sexual activity: Not Currently    Partners: Male    Birth control/protection: Abstinence  Other Topics Concern   Not on file  Social History Narrative   Not on file   Social Drivers of Health   Financial Resource Strain: Low Risk  (11/19/2023)   Overall Financial Resource Strain (CARDIA)    Difficulty of Paying Living Expenses: Not very hard  Food Insecurity: No Food Insecurity (11/19/2023)   Hunger Vital Sign    Worried About Running Out of Food in the Last Year: Never true    Ran Out of Food in the Last Year: Never true  Transportation Needs: No Transportation Needs (11/19/2023)   PRAPARE - Administrator, Civil Service (Medical): No    Lack of Transportation (Non-Medical): No   Physical Activity: Insufficiently Active (11/19/2023)   Exercise Vital Sign    Days of Exercise per Week: 2 days    Minutes of Exercise per Session: 10 min  Stress: No Stress Concern Present (11/19/2023)   [employer] of Occupational Health - Occupational Stress Questionnaire    Feeling of Stress: Not at all  Social Connections: Moderately Integrated (11/19/2023)   Social Connection and Isolation Panel    Frequency of Communication with Friends and Family: More than three times a week    Frequency of Social Gatherings with Friends and Family: Once a week    Attends Religious Services: 1 to 4 times per year    Active Member of [employer] or Organizations: Yes    Attends Banker Meetings: More than 4 times per year    Marital Status: Divorced  Intimate Partner Violence: Not At Risk (09/19/2023)   Humiliation, Afraid, Rape, and Kick questionnaire    Fear of Current or Ex-Partner: No    Emotionally Abused: No    Physically Abused: No    Sexually Abused: No   Family History  Problem Relation Age of Onset   Hypertension Mother    Stroke Mother    Hypertension Father    Hypertension Sister    Other Sister 72       DEC MVA   Past Surgical History:  Procedure Laterality Date   CESAREAN SECTION  2009   REDUCTION MAMMAPLASTY Bilateral 2003   TOTAL HIP ARTHROPLASTY Left 10/05/2021   Procedure: TOTAL HIP ARTHROPLASTY ANTERIOR APPROACH;  Surgeon: Fidel Rogue, MD;  Location: [facility];  Service: Orthopedics;  Laterality: Left;  150   WISDOM TOOTH EXTRACTION       Rolinda Rogue, MD 11/28/23 [phone]

## 2023-11-28 NOTE — Discharge Instructions (Signed)
You may use over the counter ibuprofen or acetaminophen as needed.  For a sore throat, over the counter products such as [employer] Peroxyl Mouth Sore Rinse or Chloraseptic Sore Throat Spray may provide some temporary relief. Your rapid strep test was negative today.

## 2024-03-21 ENCOUNTER — Ambulatory Visit: Admitting: Family

## 2024-03-21 ENCOUNTER — Encounter: Payer: Self-pay | Admitting: Family

## 2024-03-21 VITALS — BP 120/74 | HR 78 | Temp 97.3°F | Ht 63.0 in | Wt 222.8 lb

## 2024-03-21 DIAGNOSIS — E66812 Obesity, class 2: Secondary | ICD-10-CM | POA: Diagnosis not present

## 2024-03-21 DIAGNOSIS — Z23 Encounter for immunization: Secondary | ICD-10-CM | POA: Diagnosis not present

## 2024-03-21 DIAGNOSIS — E782 Mixed hyperlipidemia: Secondary | ICD-10-CM

## 2024-03-21 DIAGNOSIS — I10 Essential (primary) hypertension: Secondary | ICD-10-CM | POA: Diagnosis not present

## 2024-03-21 DIAGNOSIS — Z6839 Body mass index (BMI) 39.0-39.9, adult: Secondary | ICD-10-CM | POA: Diagnosis not present

## 2024-03-21 DIAGNOSIS — L709 Acne, unspecified: Secondary | ICD-10-CM | POA: Insufficient documentation

## 2024-03-21 MED ORDER — VALSARTAN-HYDROCHLOROTHIAZIDE 80-12.5 MG PO TABS: 1.0000 | ORAL_TABLET | Freq: Every morning | ORAL | 1 refills | Status: AC

## 2024-03-21 MED ORDER — ROSUVASTATIN CALCIUM 5 MG PO TABS: 5.0000 mg | ORAL_TABLET | Freq: Every day | ORAL | 1 refills | Status: AC

## 2024-03-21 NOTE — Progress Notes (Signed)
 Patient ID: Kathy Harrison, female    DOB: February 01, 1971, 53 y.o.   MRN: [id number]  Chief Complaint  Patient presents with   Essential (primary) hypertension  Discussed the use of AI scribe software for clinical note transcription with the patient, who gave verbal consent to proceed.  History of Present Illness Kathy Harrison is a 53 year old female with hypertension who presents for routine follow-up and concerns about eye twitching.  She takes valsartan  hydrochlorothiazide  every morning and monitors her blood pressure at home, which she reports is controlled.  She recently started Crestor , increased to the prescribed dose, and takes it nightly. She has not yet rechecked her cholesterol since starting.  She has intermittent, painless eyelid twitching without vision changes, drainage, or dry eye symptoms. She associates this with prolonged screen time for work.  She has an enlarged area behind her eye that is monitored annually by her eye doctor and no glaucoma.  Assessment & Plan Essential hypertension Blood pressure controlled with valsartan - hydrochlorothiazide . Home monitoring satisfactory. - Continue valsartan -hydrochlorothiazide  80-12.5mg  daily. - Sent 90-day supply w/1 RF for valsartan  hydrochlorothiazide . - F/U in 6 mos  Mixed hyperlipidemia On Crestor  with completed titration, tolerating, no SE. Confident in cholesterol improvement. Recheck at next visit. - Continue Crestor  5mg  nightly, sending refill - Recheck cholesterol levels at next visit in 6mos.  General Health Maintenance Received pneumonia (Prevnar 20) and flu vaccines today. Explained vaccine effectiveness and possible mild side effects. - Encouraged annual flu vaccination. - Scheduled follow-up in six months.   Subjective:    Outpatient Medications Prior to Visit  Medication Sig Dispense Refill   rosuvastatin  (CRESTOR ) 5 MG tablet Take 1 tablet (5 mg total) by mouth as directed. Start with 1 pill on Monday,Wed,  Fri. for 2 weeks, then increase to 1 pill qod x 2w, then 1 pill qd 90 tablet 1   valsartan -hydrochlorothiazide  (DIOVAN -HCT) 80-12.5 MG tablet Take 1 tablet by mouth every morning. 90 tablet 1   promethazine -dextromethorphan (PROMETHAZINE -DM) 6.25-15 MG/5ML syrup Take 5 mLs by mouth 4 (four) times daily as needed for cough. 118 mL 0   No facility-administered medications prior to visit.   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  Constitutional:      Appearance: Normal appearance. She is obese.  Cardiovascular:     Rate and Rhythm: Normal rate and regular rhythm.  Pulmonary:     Effort: Pulmonary effort is normal.     Breath sounds: Normal breath sounds.  Musculoskeletal:        General: Normal range of motion.  Skin:    General: Skin is warm and dry.  Neurological:     Mental Status: She is alert.  Psychiatric:  Mood and Affect: Mood normal.        Behavior: Behavior normal.    BP 120/74 (BP Location: Left Arm, Patient Position: Sitting, Cuff Size: Normal)   Pulse 78   Temp (!) 97.3 F (36.3 C) (Temporal)   Ht 5' 3 (1.6 m)   Wt 222 lb 12.8 oz (101.1 kg)   LMP 07/09/2017   SpO2 98%   BMI 39.47 kg/m  Wt Readings from Last 3 Encounters:  03/21/24 222 lb 12.8 oz (101.1 kg)  11/20/23 221 lb  4 oz (100.4 kg)  10/04/23 220 lb (99.8 kg)      Kamari Bilek, NP     [1]  Allergies Allergen Reactions   Aspirin  Nausea And Vomiting    If not coated

## 2024-09-19 ENCOUNTER — Ambulatory Visit: Admitting: Family
